# Patient Record
Sex: Female | Born: 1965 | Race: White | Hispanic: No | Marital: Single | Smoking: Never smoker
Health system: Southern US, Community
[De-identification: ages and names within clinical notes are randomized; demographics above are authoritative.]

## PROBLEM LIST (undated history)

## (undated) DIAGNOSIS — R51 Headache: Secondary | ICD-10-CM

## (undated) DIAGNOSIS — Z87442 Personal history of urinary calculi: Secondary | ICD-10-CM

## (undated) DIAGNOSIS — B019 Varicella without complication: Secondary | ICD-10-CM

## (undated) DIAGNOSIS — M1990 Unspecified osteoarthritis, unspecified site: Secondary | ICD-10-CM

## (undated) HISTORY — DX: Headache: R51

## (undated) HISTORY — PX: ANKLE SURGERY: SHX546

## (undated) HISTORY — DX: Varicella without complication: B01.9

## (undated) HISTORY — PX: SHOULDER ARTHROSCOPY: SHX128

## (undated) HISTORY — DX: Unspecified osteoarthritis, unspecified site: M19.90

## (undated) HISTORY — PX: HAND SURGERY: SHX662

---

## 1998-12-07 ENCOUNTER — Ambulatory Visit (HOSPITAL_BASED_OUTPATIENT_CLINIC_OR_DEPARTMENT_OTHER): Admission: RE | Admit: 1998-12-07 | Discharge: 1998-12-07 | Payer: Self-pay | Admitting: Orthopedic Surgery

## 2002-04-23 ENCOUNTER — Emergency Department (HOSPITAL_COMMUNITY): Admission: EM | Admit: 2002-04-23 | Discharge: 2002-04-23 | Payer: Self-pay | Admitting: Emergency Medicine

## 2002-12-01 ENCOUNTER — Inpatient Hospital Stay (HOSPITAL_COMMUNITY): Admission: AD | Admit: 2002-12-01 | Discharge: 2002-12-03 | Payer: Self-pay | Admitting: Orthopedic Surgery

## 2003-06-02 HISTORY — PX: JOINT REPLACEMENT: SHX530

## 2007-03-10 ENCOUNTER — Emergency Department (HOSPITAL_COMMUNITY): Admission: EM | Admit: 2007-03-10 | Discharge: 2007-03-11 | Payer: Self-pay | Admitting: Emergency Medicine

## 2007-12-10 ENCOUNTER — Ambulatory Visit (HOSPITAL_COMMUNITY): Admission: RE | Admit: 2007-12-10 | Discharge: 2007-12-10 | Payer: Self-pay | Admitting: Obstetrics and Gynecology

## 2010-12-04 NOTE — Op Note (Signed)
NAMEMAHEEN, CWIKLA               ACCOUNT NO.:  [id number]   MEDICAL RECORD NO.:  [id number]          PATIENT TYPE:  AMB   LOCATION:  SDC                           FACILITY:  [facility]   PHYSICIAN:  Lenoard Aden, M.D.DATE OF BIRTH:  [birth year]   DATE OF PROCEDURE:  12/10/2007  DATE OF DISCHARGE:                               OPERATIVE REPORT   PREOPERATIVE DIAGNOSIS:  Dysfunctional uterine bleeding, probable  structural lesion.   POSTOPERATIVE DIAGNOSIS:  Large endometrial polyp.   PROCEDURE:  Diagnostic hysteroscopy, D&C, resectoscopic polypectomy.   SURGEON:  Lenoard Aden, M.D.   ANESTHESIA:  MAC, paracervical.   ESTIMATED BLOOD LOSS:  Less than 50 mL.   COMPLICATIONS:  None.   DRAINS:  None.   COUNTS:  Correct.   Patient went to recovery in good condition.   FLUID DEFICIT:  150 mL.   OPERATIVE NOTE:  After being apprised of the risks of anesthesia,  infection, bleeding, injury to abdominal organs with need for repair,  delayed versus immediate complications to include bowel and bladder  complications due to uterine perforation, with possible need for repair,  the patient was brought to the operating room, and she was admitted IV  sedation without difficulty, prepped and draped in the usual sterile  fashion.  Catheterized until the bladder was emptied.  Exam under  anesthesia revealed a small anteflexed uterus.  No adnexal masses.  The  cervix was dilated up to a #23 Pratt dilator.  Diagnostic hysteroscope  placed.  Large hemorrhagic anterior wall polyp seen.  Hysteroscope  removed.  Resectoscope placed.  Please note that before placement of the  hysteroscope, further dilated up to a #33 Pratt dilator.  Please note  that prior to the dilatation, a paracervical block was placed using 30  mL of dilute Nesacaine solution in a standard fashion, and a dilute  Pitressin solution 16 mL total was placed at 3 and 9 o'clock at the  cervicovaginal junction.  Upon placement  of the resectoscope, it was  noted a large anterior wall polyp which was resected in multiple passes  using a single loop.  Good hemostasis was noted.  Visualization reveals  complete resection of polyp.  D&C is performed in a four-quadrant method  with a sharp endometrial curette.  Revisualization reveals the cavity to  be empty.  Good hemostasis was noted.  All instruments were removed.  The patient tolerated the procedure well and was transferred to the  recovery room in good condition.      Lenoard Aden, M.D.  Electronically Signed     RJT/MEDQ  D:  12/10/2007  T:  12/10/2007  Job:  [id number]

## 2010-12-07 NOTE — Discharge Summary (Signed)
Colleen Johnson, Colleen Johnson                           ACCOUNT NO.:  [id number]   MEDICAL RECORD NO.:  [id number]                   PATIENT TYPE:  INP   LOCATION:  5021                                 FACILITY:  [facility]   PHYSICIAN:  Deidre Ala, M.D.                 DATE OF BIRTH:  [birth year]   DATE OF ADMISSION:  11/30/2002  DATE OF DISCHARGE:  12/03/2002                                 DISCHARGE SUMMARY   ADMISSION DIAGNOSIS:  Patellofemoral degenerative joint disease right knee.   DISCHARGE DIAGNOSES:  1. Patellofemoral degenerative joint disease right knee.  2. Status post patellofemoral replacement.   HISTORY OF PRESENT ILLNESS AND HOSPITAL COURSE:  The patient was admitted to  [facility], taken to the operating room on Nov 30, 2002, for a  patellofemoral replacement.  It was initially planned to do a Fulkerson  slide with a possibility of the replacement depending on the intraoperative  findings.  A significant amount of degenerative joint disease was found on  the medial patellar facet, and we felt that she would not do well with the  slide, so a patellofemoral replacement was performed.  This was done under  general anesthesia with a femoral nerve block.  Tourniquet time was 1 hour  and 4 minutes.  The wound was closed over two medium Hemovac drains hooked  up to an Autovac.  There was an estimated blood loss of less then 100 cc,  and she was taken to the recovery room in stable condition.  On Dec 01, 2002, postoperative day #1, she was without complaints, had minimal pain,  and was well controlled with her PCA Dilaudid.  She had one episode of  vomiting the night before, but was doing better in the morning.  No nausea,  and she was taking p.o. well.  Her vital signs were stable, she was  afebrile.  The dressing over her knee was clean and dry, there was no active  drainage.  Her calf was soft and nontender, she was neurovascularly intact.  Postoperative labs showed a  hemoglobin of 11.4, hematocrit of 33, and serum  chemistries were normal except for a sodium of 133, potassium low at 3.4,  BUN low at 3, glucose elevated at 130.  She was given potassium chloride 20  mEq one daily.  They were encouraging her to use p.o. pain medication to  decrease reliance on PCA, encouraging her to ambulate weightbearing as  tolerated, anticipating discharge within the next few days.  Physical  therapy, occupational therapy, and rehabilitation were consulted.  They were  all in agreement that she could be discharged home with home health physical  therapy within a few days.  On Dec 02, 2002, postoperative day #2, she was  without complaints.  She had requested that they discontinue her IV, PCA,  and Foley earlier that morning.  She had been in CPM  from 0 to 40 degrees  without any difficulties.  Vitals were stable, she was afebrile, the wound  was benign, there was no erythema, ecchymoses, or drainage.  The drain was  discontinued.  It was pulled out intact.  Calf was soft and nontender, and  she was neurovascularly intact.  Repeat labs showed a hemoglobin of 11.4,  hematocrit of 34.6, and serum chemistries were normal except for a BUN low  at 3.  Her PCA and Foley were discontinued.  The IV was decreased to a  saline well.  We continued her on CPM, setting her for Lovenox post  discharge.  Home health physical therapy, home health CPM machine.  We  planned to send her home the next day if she felt up to.  On Dec 03, 2002,  postoperative day #3, she was without complaints and ready to go home.  She  was stating that she would need either crutches or a walker for her  discharge.  On physical examination, her vitals were stable, she was  afebrile, the right knee was benign, the calf was soft and nontender,  neurovascularly intact.  Hemoglobin was 11, hematocrit 34, and chemistries  normal except for a low BUN of 5.  Planned to discontinue her IV, discharge  her home  today.   ACTIVITY:  Weightbearing as tolerated on the right using an immobilizer on  the right knee, and crutches until physical therapy clears her for  ambulation without.   WOUND CARE:  Keep it clean and dry.  She may shower starting on Saturday,  but she is to leave the Steri-Strips alone until they peeled off their own  accord.   DIET:  As tolerated.   DISCHARGE INSTRUCTIONS:  Call for increasing pain, redness, drainage or  bleeding, numbness or tingling, coughing, shortness of breath, fever greater  then 100.5, or chills.   DISCHARGE MEDICATIONS:  1. OxyContin 10 mg one q.12h. #14.  2. Percocet 325/5 mg one to two p.o. q.4-6h. p.r.n. #50.'  3. Skelaxin 800 mg one p.o. q.6-8h. p.r.n. spasm or cramping #50 with two     refills.  4. Lovenox 40 mg one subcutaneously daily #6 with no refills.   CONDITION ON DISCHARGE:  Stable.   FOLLOWUP:  Follow up with Dr. Renae Fickle on Wednesday, Dec 15, 2002.  She was  instructed to call for an appointment.     Madilyn Fireman, P.A.-C.                       Deidre Ala, M.D.    AC/MEDQ  D:  12/03/2002  T:  12/04/2002  Job:  [id number]

## 2010-12-07 NOTE — Op Note (Signed)
NAMEANNIAH, GLICK                           ACCOUNT NO.:  [id number]   MEDICAL RECORD NO.:  [id number]                   PATIENT TYPE:  OIB   LOCATION:  2854                                 FACILITY:  [facility]   PHYSICIAN:  Deidre Ala, M.D.                 DATE OF BIRTH:  Jan 12, 1966   DATE OF PROCEDURE:  11/30/2002  DATE OF DISCHARGE:                                 OPERATIVE REPORT   PREOPERATIVE DIAGNOSIS:  Chondromalacia patella and anterior knee pain, rule  out patellofemoral arthrosis.   POSTOPERATIVE DIAGNOSIS:  Chondromalacia patella and anterior knee pain,  patellofemoral arthrosis.   OPERATION PERFORMED:  Right knee patellofemoral replacement with Depuy  components cemented, unicompartmental knee.   SURGEON:  Bradley Ferris, M.D.   ASSISTANT:  Madilyn Fireman, P.A.-C.   ANESTHESIA:  General endotracheal.   CULTURES:  None.   DRAINS:  Two medium Hemovacs to Autovac.   ESTIMATED BLOOD LOSS:  Less than .   BLOOD REPLACED:  Without.   TOURNIQUET TIME:  One hour four minutes.   PATHOLOGIC FINDINGS AND HISTORY:  The patient is a long term patient of mine  who has had several knee arthroscopies, lateral releases with findings of  medial chondromalacia and some defect in the trochlea though almost small.  She recently began having increasing knee pain.  She had some plica pain but  was miserable with discomfort. We got an MRI scan which showed thinning of  the cartilage on the patellar side and so we were conflicted as to whether  she would be best served with a Fulkerson slide type tubercle plasty with  Insall proximal realignment versus a unicompartmental joint replacement.  We  told her that we would have to make this decision at surgery.  At surgery,  it seemed to me that the patella was anatomically thick and she was somewhat  overstuffed in the anterior compartment.  There was a fair amount of scar  tissue we did excise but we felt that the femoral side  cartilage  was thin,  irregular and would not tolerate a Fulkerson slide well.  In fact, the  superior medial one fourth of the patella was severe chondromalacic and had  to be debrided to bone.  Therefore, we felt that the best procedure at this  point was a unicompartmental.  We sized her to a small.  We did decrease the  thickness of the patella by cutting from a 22 down to a 12 replacing 8 with  a 32 mm all poly button and we seemed to get tracking that was very  physiologic, not feeling to be overstuffed and tracking beautifully within  the joint with the replacement.   DESCRIPTION OF PROCEDURE:  With adequate anesthesia obtained using  endotracheal technique, 1g Ancef given IV prophylaxis and another one at  tourniquet let down, the patient was placed in supine position.  The  right  lower extremity was prepped and draped in standard fashion.  We then made an  incision slightly medial to the patella and then lateral over the tubercle  if we were going to do the tubercle-plasty.  Incision was deepened sharply  with a knife and hemostasis obtained using the Bovie electrocoagulator.  We  then used the Insall approach to the knee by incising the patellar ligament  over the patella and taking it medially entering the joint through a medial  arthrotomy and extending the incision splitting the patellar tendon and its  ligament in the quadriceps.  We then everted the patella, debrided the back,  assessed the joint, found nothing at the joint line.  She was very tiny and  has a very tiny knee.  We therefore then decided to proceed with the  patellofemoral replacement.  We then sized to the small.  We marked the  outline with methylene blue and then used the osteotome and bur to create  the defect for the patella for the femoral component to be flush in the  groove.  The tip was just above the notch.  We then went back and forth,  back and forth with the bur until we got perfect fit from our  viewpoint and  got down to the subchondral bone. We then drilled the three holes.  We then  put the trial in place, impacted it.  I then cut the patella using free hand  technique from a 22 down to a 12.  I placed a 3-hole guide, drilled it,  placed the patellar button trial on, slightly medializing it with the  position and then took the knee through a range of motion from 0 to 45.  I  then removed all trial components, jet lavaged the knee, mixed the cement  with 750 mg of Zinacef one batch and then cemented on the femoral component,  impacted it, removed the excess cement, cemented on the patellar component,  impacted it and removed excess cement and held it until the cement had  cured.  We then let the tourniquet down, bleeding points were cauterized.  Hemovac drains were placed in medial and lateral gutters and brought out the  superolateral portal. The wound was then closed in layers with #1 Vicryl on  the tendon and retinaculum.  An oversew of #1 PDS interrupted locking 3-0  and 4-0 Vicryl and running 3-0 Monocryl with Steri-Strips.  A bulky sterile  compressive dressing was applied.  Hemovac hooked up to Autovac and the  patient had a knee immobilizer placed after sterile dressing was applied to  be later having a femoral nerve block.  The patient was then admitted for  overnight routine care from the post anesthesia care unit.  She was  delivered to the PACU in satisfactory condition.                                                Deidre Ala, M.D.    VEP/MEDQ  D:  11/30/2002  T:  12/01/2002  Job:  [id number]

## 2011-04-17 LAB — CBC
Platelets: 330
RDW: 13.5
WBC: 6

## 2012-04-08 ENCOUNTER — Ambulatory Visit (INDEPENDENT_AMBULATORY_CARE_PROVIDER_SITE_OTHER): Payer: 59 | Admitting: Internal Medicine

## 2012-04-08 ENCOUNTER — Encounter: Payer: Self-pay | Admitting: Internal Medicine

## 2012-04-08 VITALS — BP 102/64 | HR 74 | Temp 98.0°F | Ht 61.25 in | Wt 116.0 lb

## 2012-04-08 DIAGNOSIS — R079 Chest pain, unspecified: Secondary | ICD-10-CM

## 2012-04-08 MED ORDER — GABAPENTIN 300 MG PO CAPS: 300.0000 mg | ORAL_CAPSULE | Freq: Three times a day (TID) | ORAL | Status: DC

## 2012-04-08 NOTE — Progress Notes (Signed)
  Subjective:    Patient ID: Colleen Johnson, female    DOB: [birth year], 46 y.o.   MRN: [id number]  HPI New  patient, here with her mother One-year history of on and off chest pain, 2 months history of steady pain. The pain is located at both sides of the upper anterior chest, no radiation, no change with deep breaths or eating. She had extensive evaluation in the recent past including a stress test, EGD and a rheumatology evaluation, test for "inflammation" were negative. Initially was prescribed citalopram and 3 days ago started Lyrica for the provisional diagnosis of myofascial pain. Lyrica is causing headaches and pains.   Past medical history CP DJD  Past surgical history Tonsillectomy Ortho Dr Renae Fickle Shoulder scopes x 2 Hand (R) x 2 surgeries Knee (R) scope and partial replacement Uterine polyp removal   Social history Single, no children, G0, lives w/ mom-dad Occupation-- Biomedical scientist tobacco-- no ETOH-- no   Family history Diabetes-- no CAD-- GP, mother side , no issues before 19 y/o Stroke-- F at age 7 Colon cancer-- no Breast cancer-- no Cancer GF, type?  Review of Systems No fever chills Occasional nausea if the pain is severe,  no diarrhea. No cough,  difficulty breathing, no weight loss. Denies heartburn. Denies any type of rash in the area of pain. No neck pain per se Had a mammogram 2 years ago.     Objective:   Physical Exam  Musculoskeletal:       Arms:  General -- alert, well-developed, and well-nourished.   Neck --no thyromegaly ,  no LADs, no tender to palpation Breasts-- No mass, nodules, thickening, tenderness, bulging, retraction, inflamation, nipple discharge or skin changes noted.  no axillary lymph nodes Lungs -- normal respiratory effort, no intercostal retractions, no accessory muscle use, and normal breath sounds.   Heart-- normal rate, regular rhythm, no murmur, and no gallop.   Abdomen--soft, non-tender, no distention, no  masses, no HSM, no guarding, and no rigidity.   Extremities-- no pretibial edema bilaterally Neurologic-- alert & oriented X3 and strength normal in all extremities. Psych-- Cognition and judgment appear intact. Alert and cooperative with normal attention span and concentration.  not anxious appearing and not depressed appearing.       Assessment & Plan:   Today , I spent more than 35  min with the patient, >50% of the time counseling, and reviewing her complex medical history

## 2012-04-08 NOTE — Patient Instructions (Addendum)
Get all old records from your previous MDs. Stop Lyrica Neurontin 300 mg: 1 tablet at night for 5 days, then one tablet twice a day x 5 days , then one tablet 3 times a day Please come back in 4 weeks

## 2012-04-08 NOTE — Assessment & Plan Note (Addendum)
Chest pain on and off for one year, steady for 2 months. Status post cardiology and GI evaluation with negative results. She also saw rheumatology. Was eventually diagnosed with a myofascial pain, prescribed citalopram few weeks ago and Lyrica x last 3 days; also on Ultram and Celebrex which she takes for DJD as well. She is not tolerating Lyrica will due to headaches and shakiness. Apparently, workup did not include a gallbladder ultrasound or a CT of the chest Plan: Get records Stop Lyrica consider a gallbladder ultrasound or a CT chest. Pain management? She also requests something different from pain, will start Neurontin.

## 2012-04-13 ENCOUNTER — Telehealth: Payer: Self-pay | Admitting: Internal Medicine

## 2012-04-13 NOTE — Telephone Encounter (Signed)
Pt scheduled with Dr. Laury Axon at 11:00am tomorrow.

## 2012-04-13 NOTE — Telephone Encounter (Signed)
Caller: Colleen Johnson/Patient; Patient Name: Colleen Johnson; PCP: Willow Ora; Best Callback Phone Number: [phone]; Reason for call: Face Swelling.  Onset 04/13/12.  Sore Throat onset 9/23.  Relates she recently started Tramadol and feels is causing Headaches that cause Vomiting.  Emergent symptoms ruled out.  Home care for the interim and parameters for callback.  See provider in 24 hours per Edema, Atraumatic protocol.   Caller requests appointment for 04/13/12 with Dr. Drue Novel only.  Note to office for follow up.

## 2012-04-13 NOTE — Telephone Encounter (Signed)
Noted  

## 2012-04-14 ENCOUNTER — Telehealth: Payer: Self-pay | Admitting: Internal Medicine

## 2012-04-14 ENCOUNTER — Encounter: Payer: Self-pay | Admitting: Family Medicine

## 2012-04-14 ENCOUNTER — Ambulatory Visit (HOSPITAL_BASED_OUTPATIENT_CLINIC_OR_DEPARTMENT_OTHER)
Admission: RE | Admit: 2012-04-14 | Discharge: 2012-04-14 | Disposition: A | Discharge status: Home / Self Care | Payer: 59 | Source: Ambulatory Visit | Attending: Family Medicine | Admitting: Family Medicine

## 2012-04-14 ENCOUNTER — Ambulatory Visit (INDEPENDENT_AMBULATORY_CARE_PROVIDER_SITE_OTHER): Payer: 59 | Admitting: Family Medicine

## 2012-04-14 VITALS — BP 102/64 | HR 65 | Temp 98.3°F | Wt 114.4 lb

## 2012-04-14 DIAGNOSIS — T782XXA Anaphylactic shock, unspecified, initial encounter: Secondary | ICD-10-CM

## 2012-04-14 DIAGNOSIS — F411 Generalized anxiety disorder: Secondary | ICD-10-CM

## 2012-04-14 DIAGNOSIS — R079 Chest pain, unspecified: Secondary | ICD-10-CM

## 2012-04-14 DIAGNOSIS — R0789 Other chest pain: Secondary | ICD-10-CM

## 2012-04-14 DIAGNOSIS — F419 Anxiety disorder, unspecified: Secondary | ICD-10-CM

## 2012-04-14 LAB — HEPATIC FUNCTION PANEL
ALT: 59 U/L — ABNORMAL HIGH (ref 0–35)
Albumin: 4 g/dL (ref 3.5–5.2)
Bilirubin, Direct: 0 mg/dL (ref 0.0–0.3)
Total Protein: 7 g/dL (ref 6.0–8.3)

## 2012-04-14 LAB — CBC WITH DIFFERENTIAL/PLATELET
Basophils Relative: 0.8 % (ref 0.0–3.0)
Eosinophils Relative: 2.2 % (ref 0.0–5.0)
HCT: 44.6 % (ref 36.0–46.0)
Hemoglobin: 14.6 g/dL (ref 12.0–15.0)
MCHC: 32.7 g/dL (ref 30.0–36.0)
MCV: 89 fl (ref 78.0–100.0)
Monocytes Absolute: 0.5 10*3/uL (ref 0.1–1.0)
Neutro Abs: 3.2 10*3/uL (ref 1.4–7.7)
Neutrophils Relative %: 63.9 % (ref 43.0–77.0)
RBC: 5.01 Mil/uL (ref 3.87–5.11)
WBC: 5 10*3/uL (ref 4.5–10.5)

## 2012-04-14 LAB — BASIC METABOLIC PANEL
CO2: 26 mEq/L (ref 19–32)
Chloride: 101 mEq/L (ref 96–112)
Potassium: 4.5 mEq/L (ref 3.5–5.1)
Sodium: 142 mEq/L (ref 135–145)

## 2012-04-14 LAB — CARDIAC PANEL: Total CK: 56 U/L (ref 7–177)

## 2012-04-14 MED ORDER — IOHEXOL 350 MG/ML SOLN
80.0000 mL | Freq: Once | INTRAVENOUS | Status: AC | PRN
  Administered 2012-04-14: 80 mL via INTRAVENOUS

## 2012-04-14 NOTE — Patient Instructions (Signed)
Chest Pain (Nonspecific) It is often hard to give a specific diagnosis for the cause of chest pain. There is always a chance that your pain could be related to something serious, such as a heart attack or a blood clot in the lungs. You need to follow up with your caregiver for further evaluation. CAUSES   Heartburn.   Pneumonia or bronchitis.   Anxiety or stress.   Inflammation around your heart (pericarditis) or lung (pleuritis or pleurisy).   A blood clot in the lung.   A collapsed lung (pneumothorax). It can develop suddenly on its own (spontaneous pneumothorax) or from injury (trauma) to the chest.   Shingles infection (herpes zoster virus).  The chest wall is composed of bones, muscles, and cartilage. Any of these can be the source of the pain.  The bones can be bruised by injury.   The muscles or cartilage can be strained by coughing or overwork.   The cartilage can be affected by inflammation and become sore (costochondritis).  DIAGNOSIS  Lab tests or other studies, such as X-rays, electrocardiography, stress testing, or cardiac imaging, may be needed to find the cause of your pain.  TREATMENT   Treatment depends on what may be causing your chest pain. Treatment may include:   Acid blockers for heartburn.   Anti-inflammatory medicine.   Pain medicine for inflammatory conditions.   Antibiotics if an infection is present.   You may be advised to change lifestyle habits. This includes stopping smoking and avoiding alcohol, caffeine, and chocolate.   You may be advised to keep your head raised (elevated) when sleeping. This reduces the chance of acid going backward from your stomach into your esophagus.   Most of the time, nonspecific chest pain will improve within 2 to 3 days with rest and mild pain medicine.  HOME CARE INSTRUCTIONS   If antibiotics were prescribed, take your antibiotics as directed. Finish them even if you start to feel better.   For the next few  days, avoid physical activities that bring on chest pain. Continue physical activities as directed.   Do not smoke.   Avoid drinking alcohol.   Only take over-the-counter or prescription medicine for pain, discomfort, or fever as directed by your caregiver.   Follow your caregiver's suggestions for further testing if your chest pain does not go away.   Keep any follow-up appointments you made. If you do not go to an appointment, you could develop lasting (chronic) problems with pain. If there is any problem keeping an appointment, you must call to reschedule.  SEEK MEDICAL CARE IF:   You think you are having problems from the medicine you are taking. Read your medicine instructions carefully.   Your chest pain does not go away, even after treatment.   You develop a rash with blisters on your chest.  SEEK IMMEDIATE MEDICAL CARE IF:   You have increased chest pain or pain that spreads to your arm, neck, jaw, back, or abdomen.   You develop shortness of breath, an increasing cough, or you are coughing up blood.   You have severe back or abdominal pain, feel nauseous, or vomit.   You develop severe weakness, fainting, or chills.   You have a fever.  THIS IS AN EMERGENCY. Do not wait to see if the pain will go away. Get medical help at once. Call your local emergency services (911 in [place] Do not drive yourself to the hospital. MAKE SURE YOU:   Understand these instructions.     Will watch your condition.   Will get help right away if you are not doing well or get worse.  Document Released: 04/17/2005 Document Revised: 06/27/2011 Document Reviewed: 02/11/2008 [employer] Patient Information 2012 [employer]. 

## 2012-04-14 NOTE — Telephone Encounter (Signed)
Caller: Aarti/Patient; Patient Name: Colleen Johnson; PCP: Lelon Perla.; Jaxon is calling to find out what the MD would like her to take to control her chest pain. She is concerned that the pain will cause her to become Nauseous and start Vomiting. She is having pain #4 ( on 1-10 scale.) and increasing. She is refusing triage. She uses [facility] on [address] PLEASE ASK DR. PAZ AND CALL HER BACK.  Best Callback Phone Number: [phone]

## 2012-04-14 NOTE — Telephone Encounter (Signed)
When I saw her last time, I was considering a CT. Also pain management. CT today negative consequently I recommend a referral to pain management, please arrange Also, notify patient I have not seen her records from the previous physician

## 2012-04-14 NOTE — Progress Notes (Signed)
  Subjective:    Patient ID: Colleen Johnson, female    DOB: 1966-03-14, 46 y.o.   MRN: [id number]  HPI Pt here f/u chest pain.  See previous ov notes.  Pt has had cardio and rheum w/u -- all neg.   Dr Drue Novel mentioned to pt [place] abd vs ct chest but was waiting for her records from previous pcp.   Pt was upset and said something had to be done because she was on verge of losing job and "when were those tests being done?"  She originally ws here because she had a reaction to gabepentin. She stopped it 2 days ago and now feels fine.   She also can not take tramadol because of side effects.   No sob.     Review of Systems as above   Objective:   Physical Exam  Nursing note and vitals reviewed. Constitutional: She is oriented to person, place, and time. She appears well-developed and well-nourished.  Neck: Normal range of motion. Neck supple.  Cardiovascular: Normal rate, regular rhythm and normal heart sounds.  Exam reveals no gallop and no friction rub.   No murmur heard. Pulmonary/Chest: No respiratory distress. She has no wheezes. She has no rales. She exhibits tenderness.  Abdominal: Soft. Bowel sounds are normal. She exhibits mass. She exhibits no distension. There is no tenderness. There is no rebound and no guarding.  Musculoskeletal: She exhibits no edema.  Neurological: She is alert and oriented to person, place, and time.  Psychiatric:       Anxious  Insisted that something be done for her pain          Assessment & Plan:

## 2012-04-14 NOTE — Telephone Encounter (Signed)
Discussed with patient and she has been made aware cardiac panel and CT were both neg, she is still having the chest pain and would like something else for the pain. Please advise     KP

## 2012-04-14 NOTE — Assessment & Plan Note (Signed)
Cardiac and rheum w/u done.  She has also seen GI----all normal con't celexa ---f/u pcp to adjust dose and discuss further

## 2012-04-15 ENCOUNTER — Emergency Department (HOSPITAL_COMMUNITY)
Admission: EM | Admit: 2012-04-15 | Discharge: 2012-04-15 | Disposition: A | Discharge status: Home / Self Care | Payer: 59 | Attending: Emergency Medicine | Admitting: Emergency Medicine

## 2012-04-15 ENCOUNTER — Emergency Department (HOSPITAL_COMMUNITY): Payer: 59

## 2012-04-15 ENCOUNTER — Encounter (HOSPITAL_COMMUNITY): Payer: Self-pay | Admitting: *Deleted

## 2012-04-15 DIAGNOSIS — R1013 Epigastric pain: Secondary | ICD-10-CM | POA: Insufficient documentation

## 2012-04-15 DIAGNOSIS — R109 Unspecified abdominal pain: Secondary | ICD-10-CM

## 2012-04-15 DIAGNOSIS — M129 Arthropathy, unspecified: Secondary | ICD-10-CM | POA: Insufficient documentation

## 2012-04-15 DIAGNOSIS — R079 Chest pain, unspecified: Secondary | ICD-10-CM

## 2012-04-15 NOTE — Telephone Encounter (Signed)
Discussed & entered referral.

## 2012-04-15 NOTE — ED Notes (Addendum)
EMS reports pt co substernal chest pain no radiation for 2 weeks.  Pt seen at md for same and treated with pain meds. Pt has stress test yesterday.  Pain 4/10.  Pt allergies codien and morphine.  NSR on monitor, 82hr, 102/68 which is normal for pt.  99% RA.  EMS did orthostatic bp with no changes.  20g LFA Pt alert oriented X4

## 2012-04-15 NOTE — ED Notes (Signed)
Ct done yesterday, [facility]

## 2012-04-15 NOTE — Telephone Encounter (Signed)
LMOVM for pt to return call 

## 2012-04-15 NOTE — ED Provider Notes (Signed)
History     CSN: [id number]  Arrival date & time 04/15/12  4098   First MD Initiated Contact with Patient 04/15/12 [phone]      Chief Complaint  Patient presents with  . Chest Pain    (Consider location/radiation/quality/duration/timing/severity/associated sxs/prior treatment) HPI Comments: Colleen Johnson is a 46 y.o. Female who is here to be evaluated for "burning" chest pain. That has been present for 2 weeks. The pain worsened at 4 AM this morning, so she decided to come here for evaluation. She was seen yesterday by her PCP, and had a CT angiogram chest done because of ongoing chest pain. That was negative. She has occasional nausea, but it is not associated with eating. She has mild, intermittent abdominal pain, not persistent. She has no associated fever, chills, cough, or shortness of breath. She has tried Nexium for 2 weeks, without relief. She continues to take Celebrex for pain. She did not have a stress test yesterday as indicated by nursing, with documentation at 08:28. She has intolerance of narcotic pain medicine that causes vomiting. There are no known aggravating or palliative factors.  Patient is a 46 y.o. female presenting with chest pain. The history is provided by the patient.  Chest Pain     Past Medical History  Diagnosis Date  . Arthritis   . Chicken pox   . Headache     No past surgical history on file.  No family history on file.  History  Substance Use Topics  . Smoking status: Never Smoker   . Smokeless tobacco: Never Used  . Alcohol Use: No    OB History    Grav Para Term Preterm Abortions TAB SAB Ect Mult Living                  Review of Systems  Cardiovascular: Positive for chest pain.  All other systems reviewed and are negative.    Allergies  Codeine; Gabapentin; Morphine and related; Sulfa antibiotics; Tramadol; and Lyrica  Home Medications   Current Outpatient Rx  Name Route Sig Dispense Refill  . CETIRIZINE HCL 10  MG PO TABS Oral Take 10 mg by mouth daily.    Marland Kitchen CITALOPRAM HYDROBROMIDE 10 MG PO TABS Oral Take 10 mg by mouth daily.    Marland Kitchen PAPAYA PO Oral Take 5 tablets by mouth daily as needed. For upset stomach    . SUCRALFATE 1 G PO TABS Oral Take 1 tablet (1 g total) by mouth 4 (four) times daily. 60 tablet 1    BP 101/63  Pulse 81  Temp 98 F (36.7 C) (Oral)  Resp 14  SpO2 98%  LMP 03/23/2012  Physical Exam  Nursing note and vitals reviewed. Constitutional: She is oriented to person, place, and time. She appears well-developed and well-nourished.  HENT:  Head: Normocephalic and atraumatic.  Eyes: Conjunctivae normal and EOM are normal. Pupils are equal, round, and reactive to light.  Neck: Normal range of motion and phonation normal. Neck supple.  Cardiovascular: Normal rate, regular rhythm and intact distal pulses.   Pulmonary/Chest: Effort normal and breath sounds normal. She exhibits no tenderness.  Abdominal: Soft. She exhibits no distension. There is tenderness (Mild epigastric). There is no guarding.  Musculoskeletal: Normal range of motion.  Neurological: She is alert and oriented to person, place, and time. She has normal strength. She exhibits normal muscle tone.  Skin: Skin is warm and dry.  Psychiatric: Her behavior is normal. Judgment and thought content normal.  Anxious    ED Course  Procedures (including critical care time)    Date: 04/15/2012  Rate: 72  Rhythm: normal sinus rhythm  QRS Axis: right  Intervals: normal  ST/T Wave abnormalities: nonspecific T wave changes  Conduction Disutrbances:none  Narrative Interpretation:   Old EKG Reviewed: none available  Ct Angio Chest W/cm &/or Wo Cm  04/14/2012  *RADIOLOGY REPORT*  Clinical Data: Left chest pain.  CT ANGIOGRAPHY CHEST  Technique:  Multidetector CT imaging of the chest using the standard protocol during bolus administration of intravenous contrast. Multiplanar reconstructed images including MIPs were  obtained and reviewed to evaluate the vascular anatomy.  Contrast: 80mL OMNIPAQUE IOHEXOL 350 MG/ML SOLN  Comparison: None.  Findings: No filling defects in the pulmonary arteries to suggest pulmonary emboli. Heart is normal size. Aorta is normal caliber. No mediastinal, hilar, or axillary adenopathy.  Visualized thyroid and chest wall soft tissues unremarkable. Lungs are clear.  No focal airspace opacities or suspicious nodules.  No effusions. Imaging into the upper abdomen shows no acute findings.  No acute bony abnormality.  IMPRESSION: No acute findings.   Original Report Authenticated By: Cyndie Chime, M.D.    US Abdomen Complete  04/15/2012  *RADIOLOGY REPORT*  Clinical Data:  Pain.  COMPLETE ABDOMINAL ULTRASOUND  Comparison:  None.  Findings:  Gallbladder:  No gallstones, gallbladder wall thickening, or pericholecystic fluid.  Common bile duct:   Within normal limits in caliber.  Liver:  No focal lesion identified.  Within normal limits in parenchymal echogenicity.  IVC:  Appears normal.  Pancreas:  No focal abnormality seen.  Spleen:  Within normal limits in size and echotexture.  Right Kidney:   Normal in size and parenchymal echogenicity.  No evidence of mass or hydronephrosis.  Left Kidney:  Normal in size and parenchymal echogenicity.  No evidence of mass or hydronephrosis.  Abdominal aorta:  No aneurysm identified.  IMPRESSION: Negative abdominal ultrasound.   Original Report Authenticated By: Cyndie Chime, M.D.       Labs Reviewed - No data to display No results found.   1. Chest pain, unspecified   2. Abdominal pain of unknown etiology       MDM  Nonspecific chest and abdominal pain with labs and imaging from 9/24-9/25, normal. Doubt ACS, PE, GB disease. Doubt metabolic instability, serious bacterial infection or impending vascular collapse; the patient is stable for discharge.   Plan: Home Medications- usual; Home Treatments- rest; Recommended follow up- PCP in 5-7 days for  check up        Flint Melter, MD 04/18/12 1147

## 2012-04-16 ENCOUNTER — Telehealth: Payer: Self-pay | Admitting: Internal Medicine

## 2012-04-16 ENCOUNTER — Ambulatory Visit (INDEPENDENT_AMBULATORY_CARE_PROVIDER_SITE_OTHER): Payer: 59 | Admitting: Internal Medicine

## 2012-04-16 ENCOUNTER — Encounter: Payer: Self-pay | Admitting: Internal Medicine

## 2012-04-16 VITALS — BP 122/70 | HR 73 | Temp 97.8°F | Resp 14 | Wt 111.4 lb

## 2012-04-16 DIAGNOSIS — M62838 Other muscle spasm: Secondary | ICD-10-CM

## 2012-04-16 DIAGNOSIS — IMO0001 Reserved for inherently not codable concepts without codable children: Secondary | ICD-10-CM

## 2012-04-16 DIAGNOSIS — M2550 Pain in unspecified joint: Secondary | ICD-10-CM

## 2012-04-16 DIAGNOSIS — M791 Myalgia, unspecified site: Secondary | ICD-10-CM

## 2012-04-16 DIAGNOSIS — R0789 Other chest pain: Secondary | ICD-10-CM

## 2012-04-16 MED ORDER — SUCRALFATE 1 G PO TABS: 1.0000 g | ORAL_TABLET | Freq: Four times a day (QID) | ORAL | Status: DC

## 2012-04-16 NOTE — Patient Instructions (Addendum)
Mild elevation of 2  liver enzyme test; avoid excess Tylenol, alcohol & vitamin A.  If you activate My Chart; the results can be released to you as soon as they populate from the lab. If you choose not to use this program; the labs have to be reviewed, copied & mailed   causing a delay in getting the results to you.  Stop all supplements. Dissolve 1 tablet in 5 cc (1 teaspoon) of water. Take before meals and at bedtime      Please remain out of work until 04/20/12.

## 2012-04-16 NOTE — Telephone Encounter (Signed)
Caller: [name]/Patient; Patient Name: Colleen Johnson; PCP: Willow Ora; Best Callback Phone Number: [phone].  Called re work requires MD note since out of work all week.  Reports still very sick with ongoing vomiting and body spasms. Last emesis dry heaves at 0900.  No diarrhea.  Called 911 04/15/12 for severe chest with body spasms.  Was told to follow up with Dr Drue Novel for referral to specialist. Gallbladder scan negative.  Did not take Citalopram since 04/14/12. Constant chest pain under bilateral breasts rated 4-5 out of 10.  Pain worsens if coughs.  Reports "convulsions" described as "severe spasming of entire body" lasting 5-20 seconds without loss of consciousness.  Onset 1600 04/15/12.  Thinks spasms are withdrawl from Tramadol and Neurontin.  Last one approximately 1018.  LMP 03/22/12. Not sexually active. Advised to see MD within 4 hours for repeated vomiting for > 8 hours and unable to keep any fluids down  and new onset of moderate tremors per Withdrawl Symptoms guideline.  No appointments remain with Dr Drue Novel or MD of PM, Dr Laury Axon.  30 minute appointment scheduled for Dr Alwyn Ren at [facility] 04/16/12.

## 2012-04-16 NOTE — Progress Notes (Signed)
  Subjective:    Patient ID: Colleen Johnson, female    DOB: July 10, 1966, 46 y.o.   MRN: [id number]  HPI She returns complaining of diffuse myalgias and arthralgias; she describes it as "my entire body hurts". She has been evaluated here 9/18 and 9/24. Those records were reviewed. The lab & imaging studies were also reviewed.  Despite stopping the gabapentin and tramadol the symptoms have persisted. She states that  her body will stiffen and she shakes.  Pertinent negatives were normal calcium, potassium, and CK. Despite nausea and vomiting; there is no evidence of dehydration or hypokalemia. There is mild elevation of the 2 liver enzymes. Ultrasound did not reveal abnormalities of the gallbladder or common bile duct.    Review of Systems She denies dysuria, pyuria, or hematuria. Her stool was becoming watery. There has been no Clay-colored stool or  Coke-colored urine. She describes constant substernal burning for several months. She denies dysphagia.  Citalopram was prescribed approximately 2 months ago to help with the pain symptoms. She was on tramadol for a month; this was discontinued  9/23. Her mother questions a withdrawal syndrome related to stopping the tramadol suddenly. She has had various symptoms prior to starting on these medications.  She's had headaches related to her medicines. She denies a constellation of headache, flushing, chest pain, and diarrhea.     Objective:   Physical Exam  General appearance is one of good health and nourishment w/o distress. Appears fatigued  Eyes: No conjunctival inflammation or scleral icterus is present.  Neck: Supple; thyroid normal to palpation  Oral exam: Dental hygiene is good; lips and gums are healthy appearing.There is no oropharyngeal erythema or exudate noted.   Heart:  Normal rate and regular rhythm. S1 and S2 normal without gallop, murmur, click, rub or other extra sounds     Lungs:Chest clear to auscultation; no  wheezes, rhonchi,rales ,or rubs present.No increased work of breathing.   Abdomen: bowel sounds normal, soft and non-tender without masses, organomegaly or hernias noted.  No guarding or rebound.Aorta palpable ; no AAA . Umbilical ring   Skin:Warm & dry.  Intact without suspicious lesions or rashes ; no jaundice or tenting. Tatoo LS area  Musculoskeletal: Negative straight leg raising. No cyanosis, clubbing, or edema  Lymphatic: No lymphadenopathy is noted about the head, neck, axilla areas.             Assessment & Plan:  #1 diffuse arthralgias and myalgias with normal chemistries and CK  #2 substernal burning without dysphagia  #3 mild elevation of hepatic enzymes  #4 early onset diarrhea

## 2012-04-17 ENCOUNTER — Encounter: Payer: Self-pay | Admitting: Physical Medicine & Rehabilitation

## 2012-04-17 LAB — AST: AST: 30 U/L (ref 0–37)

## 2012-04-17 LAB — HEPATITIS PANEL, ACUTE
Hep A IgM: NEGATIVE
Hepatitis B Surface Ag: NEGATIVE

## 2012-04-17 LAB — SEDIMENTATION RATE: Sed Rate: 10 mm/hr (ref 0–22)

## 2012-04-17 LAB — ALT: ALT: 46 U/L — ABNORMAL HIGH (ref 0–35)

## 2012-04-17 LAB — C-REACTIVE PROTEIN: CRP: <0.5 mg/dL (ref 0.5–20.0)

## 2012-04-21 ENCOUNTER — Encounter: Payer: 59 | Attending: Physical Medicine & Rehabilitation

## 2012-04-21 ENCOUNTER — Encounter: Payer: Self-pay | Admitting: Physical Medicine & Rehabilitation

## 2012-04-21 ENCOUNTER — Ambulatory Visit (HOSPITAL_BASED_OUTPATIENT_CLINIC_OR_DEPARTMENT_OTHER): Payer: 59 | Admitting: Physical Medicine & Rehabilitation

## 2012-04-21 VITALS — BP 96/63 | HR 86 | Resp 14 | Ht 61.0 in | Wt 111.0 lb

## 2012-04-21 DIAGNOSIS — IMO0001 Reserved for inherently not codable concepts without codable children: Secondary | ICD-10-CM | POA: Insufficient documentation

## 2012-04-21 DIAGNOSIS — R0789 Other chest pain: Secondary | ICD-10-CM | POA: Insufficient documentation

## 2012-04-21 MED ORDER — DICLOFENAC SODIUM 1 % TD GEL: 2.0000 g | Freq: Four times a day (QID) | TRANSDERMAL | Status: DC

## 2012-04-21 NOTE — Patient Instructions (Addendum)
The of myofascial pain in the pectoralis muscle Treatment will be physical therapy, they will call you for Appointment Voltaren gel will be sent to your pharmacy   may return to work without restrictions

## 2012-04-21 NOTE — Progress Notes (Signed)
Subjective:    Patient ID: Colleen Johnson, female    DOB: 04-May-1966, 46 y.o.   MRN: [id number]  HPI Chief complaint: Two-month history of chest pain 46 year old female who has had intermittent chest pain over the last several years. 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 Her pain has become constant over last 2 months. She has had extensive medical workup including EKG and cardiac stress test which were negative, CT angiogram of her Chest which was negative, rheumatologic workup which was negative, gastrointestinal consultation which was negative. She has tried various medications codeine causing nausea and vomiting, tramadol causing headaches, Lyrica Causing headaches and shaking. Lidocaine patches were not helpful She has not tried physical therapy She has not tried injections  Review of systems positive for knee pain right greater than left Pain Inventory Average Pain 4 Pain Right Now 4 My pain is sharp and burning  In the last 24 hours, has pain interfered with the following? General activity 7 Relation with others 7 Enjoyment of life 10 What TIME of day is your pain at its worst? All Day Sleep (in general) Fair  Pain is worse with: unsure Pain improves with: heat/ice Relief from Meds: 0  Mobility walk without assistance ability to climb steps?  yes do you drive?  yes  Function employed # of hrs/week 40  Neuro/Psych depression anxiety  Prior Studies x-rays CT/MRI  Physicians involved in your care Primary care Dr. Drue Novel   History reviewed. No pertinent family history. History   Social History  . Marital Status: Single    Spouse Name: N/A    Number of Children: N/A  . Years of Education: N/A   Social History Main Topics  . Smoking status: Never Smoker   . Smokeless tobacco: Never Used  . Alcohol Use: No  . Drug Use: No  . Sexually Active: None   Other Topics Concern  . None   Social History Narrative  . None   History reviewed. No  pertinent past surgical history. Past Medical History  Diagnosis Date  . Arthritis   . Chicken pox   . Headache    LMP 03/23/2012      Review of Systems  Constitutional: Negative.   HENT: Negative.   Eyes: Negative.   Respiratory: Negative.   Cardiovascular: Positive for chest pain.  Gastrointestinal: Negative.   Genitourinary: Negative.   Musculoskeletal: Negative.   Skin: Negative.   Neurological: Negative.   Hematological: Negative.   Psychiatric/Behavioral:       Depression/Anxiety       Objective:   Physical Exam  Constitutional: She is oriented to person, place, and time. She appears well-developed and well-nourished.  HENT:  Head: Normocephalic and atraumatic.  Eyes: Conjunctivae normal and EOM are normal. Pupils are equal, round, and reactive to light.  Neck: Normal range of motion. Neck supple.  Cardiovascular: Normal rate, regular rhythm and normal heart sounds.   Pulmonary/Chest: Effort normal and breath sounds normal.  Abdominal: Soft. Bowel sounds are normal.  Musculoskeletal:       Right shoulder: Normal.       Left shoulder: Normal.       Cervical back: Normal.       Thoracic back: Normal.       Tenderness to palpation along the pectoralis muscle origin along the sternum. Tenderness along the clavicular portion of the left pectoralis muscle Tight pectoralis with arm stretching behind the back Shoulders with negative impingement testing  Neurological: She is alert and  oriented to person, place, and time. She has normal strength and normal reflexes. She displays no atrophy. She exhibits normal muscle tone. Coordination and gait normal.  Psychiatric: Her affect is angry.          Assessment & Plan:  1. Myofascial pain pectoralis muscle left greater than right. Recommend Voltaren gel 4 times per day. Recommend heat Recommend physical therapy for pectoralis straight bending stretching as well as upper extremity and upper back  strengthening. Return to clinic one month

## 2012-04-21 NOTE — Addendum Note (Signed)
Addended by: Caryl Ada on: 04/21/2012 12:19 PM   Modules accepted: Orders

## 2012-04-24 ENCOUNTER — Telehealth: Payer: Self-pay | Admitting: Internal Medicine

## 2012-04-24 NOTE — Telephone Encounter (Signed)
Please advise 

## 2012-04-24 NOTE — Telephone Encounter (Signed)
Discussed with pt

## 2012-04-24 NOTE — Telephone Encounter (Signed)
Caller: [name]/Patient; Patient Name: Colleen Johnson; PCP: Willow Ora; Best Callback Phone Number: [phone] about starting on Carafate 4x daily on 04/16/12 and everyday since starting the medication she has had burning in her stomach and loose stools 3-4 times daily. Afebrile. She is eating ice chips and it cools down her stomach. Appetite diminished. Pain waking her up at night. Pain level #5 on 1-10 scale. Triage per Abdominal Pain Protocol and ED advised for "Abdominal pain that has steadily worsened over hours OR has been continuous for 3 hours or more AND any of the following:loss of appetite, vomiting starting after pain, any fever OR unable to carry out normal activities. She is refusing to go to ER and would like to stop med and needs advice of what to take instead. She would like to try Gavison or something to coat her stomach. Please ask Dr. Drue Novel or MD on call and call her back.

## 2012-04-24 NOTE — Telephone Encounter (Signed)
Discontinue Carafate Prilosec over-the-counter 20 mg 2 tablets a day before breakfast. Okay trial with Gaviscon if needed

## 2012-05-05 ENCOUNTER — Ambulatory Visit: Payer: 59 | Attending: Physical Medicine & Rehabilitation | Admitting: Physical Therapy

## 2012-05-05 DIAGNOSIS — R293 Abnormal posture: Secondary | ICD-10-CM | POA: Insufficient documentation

## 2012-05-05 DIAGNOSIS — IMO0001 Reserved for inherently not codable concepts without codable children: Secondary | ICD-10-CM | POA: Insufficient documentation

## 2012-05-06 ENCOUNTER — Ambulatory Visit (INDEPENDENT_AMBULATORY_CARE_PROVIDER_SITE_OTHER): Payer: 59 | Admitting: Internal Medicine

## 2012-05-06 VITALS — BP 104/62 | HR 75 | Temp 98.0°F | Wt 115.0 lb

## 2012-05-06 DIAGNOSIS — K219 Gastro-esophageal reflux disease without esophagitis: Secondary | ICD-10-CM

## 2012-05-06 DIAGNOSIS — R079 Chest pain, unspecified: Secondary | ICD-10-CM

## 2012-05-06 DIAGNOSIS — Z23 Encounter for immunization: Secondary | ICD-10-CM

## 2012-05-06 NOTE — Progress Notes (Signed)
  Subjective:    Patient ID: Colleen Johnson, female    DOB: [birth year], 46 y.o.   MRN: [id number]  HPI Followup visit Chest pain, went to see Dr. Jodean Lima, diagnosed with myofascial pain, started physical therapy and will use a voltaren gel. Feeling much better Also, had some GERD symptoms and dysphagia, currently on Nexium and doing well.   Past medical history CP DJD  Past surgical history Tonsillectomy Ortho Dr Renae Fickle Shoulder scopes x 2 Hand (R) x 2 surgeries Knee (R) scope and partial replacement Uterine polyp removal   Social history Single, no children, G0, lives w/ mom-dad Occupation-- Biomedical scientist tobacco-- no ETOH-- no   Family history Diabetes-- no CAD-- GP, mother side , no issues before 87 y/o Stroke-- F at age 2 Colon cancer-- no Breast cancer-- no Cancer GF, type?   Review of Systems Denies abdominal pain at this point, no nausea, vomiting, diarrhea or blood in the stools.     Objective:   Physical Exam  General -- alert, well-developed, and well-nourished.   Abdomen--soft, non-tender, no distention, no masses, no HSM, no guarding, and no rigidity.    Psych-- Cognition and judgment appear intact. Alert and cooperative with normal attention span and concentration.  not anxious appearing and not depressed appearing.      Assessment & Plan:

## 2012-05-06 NOTE — Patient Instructions (Addendum)
Continue with Nexium for the next 4 weeks, and then changing to "as needed only" Next visit in 6-8 months and as needed

## 2012-05-07 ENCOUNTER — Encounter: Payer: Self-pay | Admitting: Internal Medicine

## 2012-05-07 DIAGNOSIS — K219 Gastro-esophageal reflux disease without esophagitis: Secondary | ICD-10-CM | POA: Insufficient documentation

## 2012-05-07 NOTE — Assessment & Plan Note (Signed)
Chest pain, diagnosis is myofascial pain, doing great.

## 2012-05-07 NOTE — Assessment & Plan Note (Signed)
GERD, had some upper abdominal discomfort and dysphagia, on Nexium, will continue with Nexium for 4 weeks then change to as needed. To call if no better or more dysphagia

## 2012-05-12 ENCOUNTER — Ambulatory Visit: Payer: 59 | Admitting: Rehabilitation

## 2012-05-18 ENCOUNTER — Ambulatory Visit: Payer: 59 | Admitting: Physical Therapy

## 2012-05-19 ENCOUNTER — Ambulatory Visit: Payer: 59 | Admitting: Physical Medicine & Rehabilitation

## 2012-05-25 ENCOUNTER — Encounter: Payer: 59 | Attending: Physical Medicine & Rehabilitation

## 2012-05-25 ENCOUNTER — Ambulatory Visit (HOSPITAL_BASED_OUTPATIENT_CLINIC_OR_DEPARTMENT_OTHER): Payer: 59 | Admitting: Physical Medicine & Rehabilitation

## 2012-05-25 ENCOUNTER — Encounter: Payer: Self-pay | Admitting: Physical Medicine & Rehabilitation

## 2012-05-25 VITALS — BP 114/69 | HR 78 | Resp 14 | Ht 61.0 in | Wt 117.2 lb

## 2012-05-25 DIAGNOSIS — IMO0001 Reserved for inherently not codable concepts without codable children: Secondary | ICD-10-CM | POA: Insufficient documentation

## 2012-05-25 DIAGNOSIS — R0789 Other chest pain: Secondary | ICD-10-CM | POA: Insufficient documentation

## 2012-05-25 NOTE — Progress Notes (Signed)
  Subjective:    Patient ID: Colleen Johnson, female    DOB: Feb 19, 1966, 46 y.o.   MRN: [id number]  HPI 46 year old female who has had intermittent chest pain over the last several years. 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 Her pain has become constant over last 2 months. She has had extensive medical workup including EKG and cardiac stress test which were negative, CT angiogram of her Chest which was negative, rheumatologic workup which was negative, gastrointestinal consultation which was negative. She has tried various medications codeine causing nausea and vomiting, tramadol causing headaches, Lyrica Causing headaches and shaking. Lidocaine patches were not helpful Pain Inventory Average Pain 3 Pain Right Now 0 My pain is sharp, burning and aching  In the last 24 hours, has pain interfered with the following? General activity 0 Relation with others 0 Enjoyment of life 0 What TIME of day is your pain at its worst? varies Sleep (in general) Fair  Pain is worse with: some activites Pain improves with: ultrasound at therapy made it worse Relief from Meds: no pain mends  Mobility walk without assistance  Function employed # of hrs/week 40  Neuro/Psych depression anxiety  Prior Studies Any changes since last visit?  no  Physicians involved in your care Any changes since last visit?  no   History reviewed. No pertinent family history. History   Social History  . Marital Status: Single    Spouse Name: N/A    Number of Children: N/A  . Years of Education: N/A   Social History Main Topics  . Smoking status: Never Smoker   . Smokeless tobacco: Never Used  . Alcohol Use: No  . Drug Use: No  . Sexually Active: None   Other Topics Concern  . None   Social History Narrative  . None   History reviewed. No pertinent past surgical history. Past Medical History  Diagnosis Date  . Arthritis   . Chicken pox   . Headache    BP 114/69  Pulse 78  Resp  14  Ht 5\' 1"  (1.549 m)  Wt 117 lb 3.2 oz (53.162 kg)  BMI 22.14 kg/m2  SpO2 99%    Review of Systems  Musculoskeletal:       Mid chest pain  Psychiatric/Behavioral: Positive for dysphoric mood. The patient is nervous/anxious.   All other systems reviewed and are negative.       Objective:   Physical Exam  Head: Normocephalic and atraumatic.  Eyes: Conjunctivae normal and EOM are normal. Pupils are equal, round, and reactive to light.  Neck: Normal range of motion. Neck supple.  Cardiovascular: Normal rate, regular rhythm and normal heart sounds.  Pulmonary/Chest: Effort normal and breath sounds normal.  Abdominal: Soft. Bowel sounds are normal.  Musculoskeletal:  Right shoulder: Normal.  Left shoulder: Normal.  Cervical back: Normal.  Thoracic back: Normal.  Tenderness to palpation along the pectoralis muscle origin along the sternum. Tenderness along the clavicular portion of the left pectoralis muscle Tight pectoralis with arm stretching behind the back Shoulders with negative impingement testing  Neurological: She is alert and oriented to person, place, and time. She has normal strength and normal reflexes. She displays no atrophy. She exhibits normal muscle tone. Coordination and gait normal       Assessment & Plan:  1. Myofascial pain pectoralis muscle left greater than right. Much improved Recommend heat  Recommend D/C physical therapy. Return to clinic PRN Cont HEP Cont prn Advil

## 2012-05-25 NOTE — Patient Instructions (Signed)
Discontinue PT after tomorrow  Continue your home exercises

## 2012-05-26 ENCOUNTER — Ambulatory Visit: Payer: 59 | Attending: Physical Medicine & Rehabilitation | Admitting: Rehabilitation

## 2012-05-26 DIAGNOSIS — R293 Abnormal posture: Secondary | ICD-10-CM | POA: Insufficient documentation

## 2012-05-26 DIAGNOSIS — IMO0001 Reserved for inherently not codable concepts without codable children: Secondary | ICD-10-CM | POA: Insufficient documentation

## 2012-09-21 ENCOUNTER — Ambulatory Visit: Payer: 59

## 2012-09-21 ENCOUNTER — Ambulatory Visit (INDEPENDENT_AMBULATORY_CARE_PROVIDER_SITE_OTHER): Payer: 59 | Admitting: Internal Medicine

## 2012-09-21 VITALS — BP 96/64 | HR 76 | Temp 98.0°F | Resp 16 | Ht 59.0 in | Wt 116.6 lb

## 2012-09-21 DIAGNOSIS — M25539 Pain in unspecified wrist: Secondary | ICD-10-CM

## 2012-09-21 DIAGNOSIS — M25531 Pain in right wrist: Secondary | ICD-10-CM

## 2012-09-21 NOTE — Progress Notes (Signed)
  Subjective:    Patient ID: Colleen Johnson, female    DOB: 1966/01/24, 47 y.o.   MRN: [id number]  HPI  fell on outstretched arm during snow one week ago Continues with pain in the wrist with some swelling No prior injuries No paresthesias    Review of Systems     Objective:   Physical Exam Is swollen on the volar aspect, radial side of the right wrist Tender to palpation Radial deviation painful Flexion and extension painful Tender in the snuff box Tender with movement of the third digit        [facility] reading (PRIMARY) by  Dr.Doolittle=? ND fx navicular.   Assessment & Plan:  Probable wrist sprain Rule out occult navicular injury or ligament injury in the carpal area  Thumb spica splint Refer to hand orthopedics

## 2013-05-27 ENCOUNTER — Other Ambulatory Visit: Payer: Self-pay

## 2014-05-06 ENCOUNTER — Other Ambulatory Visit: Payer: Self-pay

## 2014-09-26 ENCOUNTER — Encounter (HOSPITAL_COMMUNITY): Payer: Self-pay | Admitting: *Deleted

## 2014-09-26 ENCOUNTER — Emergency Department (HOSPITAL_COMMUNITY)
Admission: EM | Admit: 2014-09-26 | Discharge: 2014-09-26 | Disposition: A | Discharge status: Home / Self Care | Payer: 59 | Source: Home / Self Care | Attending: Emergency Medicine | Admitting: Emergency Medicine

## 2014-09-26 DIAGNOSIS — R232 Flushing: Secondary | ICD-10-CM

## 2014-09-26 NOTE — ED Provider Notes (Signed)
CSN: [id number]     Arrival date & time 09/26/14  1216 History   First MD Initiated Contact with Patient 09/26/14 1315     No chief complaint on file.  (Consider location/radiation/quality/duration/timing/severity/associated sxs/prior Treatment) HPI Comments: Patient reports at about 11:50am today she developed a sense flushing and states her skin (diffusely) turned red while at work. This was observed by her co-workers who encouraged her to seek evaluation. States she has had no previous episodes not has she had any new food, medication or environmental exposures. No recent illness or injury. Denies any difficulty breathing, speaking or swallowing. Symptoms lasted about 1 hour and had nearly resolved upon her arrival at the clinic. Menopausal since Sept. 2015 Does not smoke, drink alcohol or use drugs Works for [employer] in [employer].    The history is provided by the patient.    Past Medical History  Diagnosis Date  . Arthritis   . Chicken pox   . [id number](478.2)    Past Surgical History  Procedure Laterality Date  . Joint replacement  06/02/2003    partial   History reviewed. No pertinent family history. History  Substance Use Topics  . Smoking status: Never Smoker   . Smokeless tobacco: Never Used  . Alcohol Use: No   OB History    No data available     Review of Systems  All other systems reviewed and are negative.   Allergies  Codeine; Gabapentin; Morphine and related; Sulfa antibiotics; Tramadol; and Lyrica  Home Medications   Prior to Admission medications   Medication Sig Start Date End Date Taking? Authorizing Provider  Acetaminophen (TYLENOL PO) Take by mouth as needed.    Historical Provider, MD  cetirizine (ZYRTEC) 10 MG tablet Take 10 mg by mouth daily.    Historical Provider, MD  esomeprazole (NEXIUM) 40 MG capsule Take 40 mg by mouth daily before breakfast.    Historical Provider, MD  Ibuprofen (ADVIL PO) Take 1 tablet by mouth 2 (two) times  daily as needed.    Historical Provider, MD   BP 124/68 mmHg  Pulse 68  Temp(Src) 97.6 F (36.4 C) (Oral)  Resp 18  SpO2 100% Physical Exam  Constitutional: She is oriented to person, place, and time. She appears well-developed.  HENT:  Head: Normocephalic and atraumatic.  Right Ear: Hearing, tympanic membrane, external ear and ear canal normal.  Left Ear: Hearing, tympanic membrane, external ear and ear canal normal.  Nose: Nose normal.  Mouth/Throat: Uvula is midline, oropharynx is clear and moist and mucous membranes are normal. No oral lesions. No trismus in the jaw. No uvula swelling. No oropharyngeal exudate.  Eyes: Conjunctivae are normal. No scleral icterus.  Neck: Neck supple. No thyromegaly present.  Cardiovascular: Normal rate, regular rhythm and normal heart sounds.   Pulmonary/Chest: Effort normal and breath sounds normal. No stridor.  Abdominal: Soft. Bowel sounds are normal. She exhibits no distension. There is no tenderness.  Musculoskeletal: Normal range of motion.  Lymphadenopathy:    She has no cervical adenopathy.  Neurological: She is alert and oriented to person, place, and time.  Skin: Skin is warm and dry. No rash noted. No erythema.  Psychiatric: She has a normal mood and affect. Her behavior is normal.  Nursing note and vitals reviewed.   ED Course  Procedures (including critical care time) Labs Review Labs Reviewed - No data to display  Imaging Review No results found.   MDM   1. Flushing   While patient does  not have any significant or worrisome physical findings and vital signs are normal. Does not take niacin supplements. Reviewed list of possible causes ranging from emotional, hormonal and environmental causes to disorders of endocrine system and neoplastic disorders such as carcinoid syndrome. Encouraged patient to pursue close follow up with her PCP should symptoms reoccur. Offered patient benadryl while in clinic and she declined stating her  symptoms have resolved.     Ria ClockJennifer Lee H Mykah Shin, [place] 09/26/14 [phone]

## 2014-09-26 NOTE — Discharge Instructions (Signed)
Please take your zyrtec when you get home and monitor your symptoms closely. Should symptoms reoccur, please arrange evaluation with your primary care doctor. Should symptoms become suddenly worse or severe, please seek immediate assistance at your local emergency room.  Please review printed information provided to you at your visit.

## 2019-04-12 ENCOUNTER — Emergency Department (HOSPITAL_BASED_OUTPATIENT_CLINIC_OR_DEPARTMENT_OTHER): Payer: 59

## 2019-04-12 ENCOUNTER — Other Ambulatory Visit: Payer: Self-pay

## 2019-04-12 ENCOUNTER — Encounter (HOSPITAL_BASED_OUTPATIENT_CLINIC_OR_DEPARTMENT_OTHER): Payer: Self-pay | Admitting: Adult Health

## 2019-04-12 ENCOUNTER — Emergency Department (HOSPITAL_BASED_OUTPATIENT_CLINIC_OR_DEPARTMENT_OTHER)
Admission: EM | Admit: 2019-04-12 | Discharge: 2019-04-12 | Disposition: A | Discharge status: Home / Self Care | Payer: 59 | Attending: Emergency Medicine | Admitting: Emergency Medicine

## 2019-04-12 DIAGNOSIS — Z882 Allergy status to sulfonamides status: Secondary | ICD-10-CM | POA: Diagnosis not present

## 2019-04-12 DIAGNOSIS — Z888 Allergy status to other drugs, medicaments and biological substances status: Secondary | ICD-10-CM | POA: Insufficient documentation

## 2019-04-12 DIAGNOSIS — Z885 Allergy status to narcotic agent status: Secondary | ICD-10-CM | POA: Diagnosis not present

## 2019-04-12 DIAGNOSIS — M79672 Pain in left foot: Secondary | ICD-10-CM | POA: Insufficient documentation

## 2019-04-12 MED ORDER — MELOXICAM 7.5 MG PO TABS: 7.5000 mg | ORAL_TABLET | Freq: Every day | ORAL | 0 refills | Status: DC

## 2019-04-12 NOTE — Discharge Instructions (Signed)
Your x-rays today were reassuring.  As we discussed, continue icing and elevating the foot to help with pain and swelling.  Take Mobic as directed.  Follow-up with either podiatry with orthopedic if symptoms do not improve in 1 to 2 weeks.  Return the emergency department for any worsening pain, redness or swelling of foot, numbness/weakness, fevers or any other worsening or concerning symptoms.

## 2019-04-12 NOTE — ED Triage Notes (Signed)
Presents with left foot pain that began 4 days ago, endorses swelling. States it started with a really bad cramp.  Deneis injury

## 2019-04-12 NOTE — ED Notes (Signed)
Pt refused post op shoe

## 2019-04-12 NOTE — ED Provider Notes (Signed)
[facility] EMERGENCY DEPARTMENT Provider Note   CSN: [id number] Arrival date & time: 04/12/19  1908     History   Chief Complaint Chief Complaint  Patient presents with   Foot Pain    HPI Colleen Johnson is a 53 y.o. female who presents for evaluation of left-sided foot pain and swelling that began about 3 to 4 days ago.  She denies any preceding trauma, injury.  She does report that she had a cramp and states that she hit the foot several times.  Afterwards, she noticed some swelling to the dorsal aspect and pain.  She reports most of her pain is located over the third, fourth, fifth metatarsal.  She states he has been able to ambulate but with some worsening pain.  She has been taking ibuprofen and minimal improvement.  Additionally, she has been doing some ice elevation with no improvement in swelling.  She denies any fevers, overlying warmth, erythema, numbness/weakness.     The history is provided by the patient.    Past Medical History:  Diagnosis Date   Arthritis    Chicken pox    Headache(784.0)     Patient Active Problem List   Diagnosis Date Noted   GERD (gastroesophageal reflux disease) 05/07/2012   Myalgia and myositis, unspecified 04/21/2012   Chest pain 04/08/2012    Past Surgical History:  Procedure Laterality Date   JOINT REPLACEMENT  06/02/2003   partial     OB History   No obstetric history on file.      Home Medications    Prior to Admission medications   Medication Sig Start Date End Date Taking? Authorizing Provider  Acetaminophen (TYLENOL PO) Take by mouth as needed.    [provider]  cetirizine (ZYRTEC) 10 MG tablet Take 10 mg by mouth daily.    [provider]  esomeprazole (NEXIUM) 40 MG capsule Take 40 mg by mouth daily before breakfast.    [provider]  Ibuprofen (ADVIL PO) Take 1 tablet by mouth 2 (two) times daily as needed.    [provider]  meloxicam (MOBIC)  7.5 MG tablet Take 1 tablet (7.5 mg total) by mouth daily. 04/12/19   Maxwell Caul, PA-C    Family History History reviewed. No pertinent family history.  Social History Social History   Tobacco Use   Smoking status: Never Smoker   Smokeless tobacco: Never Used  Substance Use Topics   Alcohol use: No   Drug use: No     Allergies   Codeine, Gabapentin, Morphine and related, Sulfa antibiotics, Tramadol, and Lyrica [pregabalin]   Review of Systems Review of Systems  Constitutional: Negative for fever.  Musculoskeletal:       Left foot pain and swelling.  Skin: Negative for color change.  Neurological: Negative for weakness and numbness.  All other systems reviewed and are negative.    Physical Exam Updated Vital Signs BP 118/84    Pulse 72    Temp 98.2 F (36.8 C)    Resp 18    LMP 09/16/2012    SpO2 100%   Physical Exam Vitals signs and nursing note reviewed.  Constitutional:      Appearance: She is well-developed.  HENT:     Head: Normocephalic and atraumatic.  Eyes:     General: No scleral icterus.       Right eye: No discharge.        Left eye: No discharge.     Conjunctiva/sclera:  Conjunctivae normal.  Cardiovascular:     Pulses:          Dorsalis pedis pulses are 2+ on the right side and 2+ on the left side.  Pulmonary:     Effort: Pulmonary effort is normal.  Musculoskeletal:     Comments: Tenderness palpation dorsal aspect of left foot particularly at the third, fourth, fifth metatarsal.  No deformity or crepitus noted.  There is very minimal overlying soft tissue swelling.  The foot itself is soft without any tension.  Compartments are soft.  No edema, warmth, erythema.  No bony deformity or crepitus noted.  No tenderness palpation noted to left ankle, left tib-fib, left knee.  No deficits noted with palpation of Achilles tendon.  Dorsiflexion and plantarflexion intact.  She can wiggle all 5 toes without any difficulty.  No tenderness palpation  noted to right lower extremity.  Skin:    General: Skin is warm and dry.     Capillary Refill: Capillary refill takes less than 2 seconds.     Comments: Good distal cap refill.  LLE is not dusky in appearance or cool to touch.  Neurological:     Mental Status: She is alert.  Psychiatric:        Speech: Speech normal.        Behavior: Behavior normal.      ED Treatments / Results  Labs (all labs ordered are listed, but only abnormal results are displayed) Labs Reviewed - No data to display  EKG None  Radiology Dg Foot Complete Left  Result Date: 04/12/2019 CLINICAL DATA:  Pain and swelling EXAM: LEFT FOOT - COMPLETE 3+ VIEW COMPARISON:  None. FINDINGS: Frontal, oblique, and lateral views were obtained. There is soft tissue swelling dorsally. No fracture or dislocation. Joint spaces appear normal. No erosive change. There is an accessory ossicle lateral to the cuboid. IMPRESSION: Soft tissue swelling dorsally. No fracture or dislocation. No evident arthropathy. Electronically Signed   By: Bretta Bang III M.D.   On: 04/12/2019 19:35    Procedures Procedures (including critical care time)  Medications Ordered in ED Medications - No data to display   Initial Impression / Assessment and Plan / ED Course  I have reviewed the triage vital signs and the nursing notes.  Pertinent labs & imaging results that were available during my care of the patient were reviewed by me and considered in my medical decision making (see chart for details).        53 year old female who presents for evaluation of left foot pain x3 to 4 days.  No preceding trauma, injury, fall.  Does report that she has some swelling over the foot. Patient is afebrile, non-toxic appearing, sitting comfortably on examination table. Vital signs reviewed and stable. Patient is neurovascularly intact.  On exam, she has tenderness palpation in the third, fourth, fifth metatarsal.  There is some very minimal overlying  swelling but appears almost at baseline when compared to her other foot.  No overlying warmth, erythema.  History/physical exam are not concerning for cellulitis, septic arthritis, acute arterial embolism/ischemic leg, DVT, comparment syndrome.  Doubt gout since there is no overlying warmth, erythema.  Question if she had some injury that she does not know of versus inflammatory process.  X-rays ordered at triage.  X-rays reviewed.  No evidence of acute bony abnormality.  There is some soft tissue swelling dorsally.  No evidence of acute fracture.  Discussed results with patient.  At this time, she is afebrile and  has no overlying warmth, erythema that would be me concern for infectious process.  Do not feel that she needs acute antibiotics.  We will plan to do supportive care measures with movement.  Additionally, patient is requesting Ace bandage and postop shoe.  Instructed her to follow-up with podiatry or orthopedic if symptoms do not improve in 1 to 2 weeks.  Instructed patient to return emergency department for any worsening concerning symptoms. At this time, patient exhibits no emergent life-threatening condition that require further evaluation in ED or admission. Patient had ample opportunity for questions and discussion. All patient's questions were answered with full understanding. Strict return precautions discussed. Patient expresses understanding and agreement to plan.   Portions of this note were generated with [employer]. Dictation errors may occur despite best attempts at proofreading.  Final Clinical Impressions(s) / ED Diagnoses   Final diagnoses:  Foot pain, left    ED Discharge Orders         Ordered    meloxicam (MOBIC) 7.5 MG tablet  Daily     04/12/19 2105           Desma Mcgregor 04/12/19 2203    Tegeler, Gwenyth Allegra, MD 04/13/19 [phone]

## 2019-06-03 ENCOUNTER — Encounter (HOSPITAL_BASED_OUTPATIENT_CLINIC_OR_DEPARTMENT_OTHER): Payer: Self-pay | Admitting: Adult Health

## 2019-06-03 ENCOUNTER — Other Ambulatory Visit: Payer: Self-pay

## 2019-06-03 ENCOUNTER — Emergency Department (HOSPITAL_COMMUNITY)
Admission: EM | Admit: 2019-06-03 | Discharge: 2019-06-03 | Disposition: A | Discharge status: Home / Self Care | Payer: No Typology Code available for payment source | Attending: Emergency Medicine | Admitting: Emergency Medicine

## 2019-06-03 ENCOUNTER — Emergency Department (HOSPITAL_COMMUNITY): Payer: No Typology Code available for payment source

## 2019-06-03 ENCOUNTER — Encounter (HOSPITAL_COMMUNITY): Payer: Self-pay | Admitting: *Deleted

## 2019-06-03 DIAGNOSIS — S50311A Abrasion of right elbow, initial encounter: Secondary | ICD-10-CM | POA: Insufficient documentation

## 2019-06-03 DIAGNOSIS — Y999 Unspecified external cause status: Secondary | ICD-10-CM | POA: Diagnosis not present

## 2019-06-03 DIAGNOSIS — S29019A Strain of muscle and tendon of unspecified wall of thorax, initial encounter: Secondary | ICD-10-CM | POA: Diagnosis not present

## 2019-06-03 DIAGNOSIS — Y9301 Activity, walking, marching and hiking: Secondary | ICD-10-CM | POA: Diagnosis not present

## 2019-06-03 DIAGNOSIS — Y9289 Other specified places as the place of occurrence of the external cause: Secondary | ICD-10-CM | POA: Diagnosis not present

## 2019-06-03 DIAGNOSIS — S199XXA Unspecified injury of neck, initial encounter: Secondary | ICD-10-CM | POA: Diagnosis present

## 2019-06-03 DIAGNOSIS — S5001XA Contusion of right elbow, initial encounter: Secondary | ICD-10-CM | POA: Insufficient documentation

## 2019-06-03 DIAGNOSIS — S161XXA Strain of muscle, fascia and tendon at neck level, initial encounter: Secondary | ICD-10-CM | POA: Insufficient documentation

## 2019-06-03 LAB — URINALYSIS, ROUTINE W REFLEX MICROSCOPIC
Bacteria, UA: NONE SEEN
Bilirubin Urine: NEGATIVE
Glucose, UA: NEGATIVE mg/dL
Ketones, ur: 5 mg/dL — AB
Leukocytes,Ua: NEGATIVE
Nitrite: NEGATIVE
Protein, ur: NEGATIVE mg/dL
Specific Gravity, Urine: 1.013 (ref 1.005–1.030)
pH: 5 (ref 5.0–8.0)

## 2019-06-03 LAB — CBC
HCT: 39.2 % (ref 36.0–46.0)
Hemoglobin: 12.8 g/dL (ref 12.0–15.0)
MCH: 28.4 pg (ref 26.0–34.0)
MCHC: 32.7 g/dL (ref 30.0–36.0)
MCV: 86.9 fL (ref 80.0–100.0)
Platelets: 299 10*3/uL (ref 150–400)
RBC: 4.51 MIL/uL (ref 3.87–5.11)
RDW: 12.7 % (ref 11.5–15.5)
WBC: 6 10*3/uL (ref 4.0–10.5)
nRBC: 0 % (ref 0.0–0.2)

## 2019-06-03 LAB — PROTIME-INR
INR: 1.1 (ref 0.8–1.2)
Prothrombin Time: 14 seconds (ref 11.4–15.2)

## 2019-06-03 LAB — COMPREHENSIVE METABOLIC PANEL
ALT: 26 U/L (ref 0–44)
AST: 30 U/L (ref 15–41)
Albumin: 4 g/dL (ref 3.5–5.0)
Alkaline Phosphatase: 69 U/L (ref 38–126)
Anion gap: 10 (ref 5–15)
BUN: 12 mg/dL (ref 6–20)
CO2: 23 mmol/L (ref 22–32)
Calcium: 9.2 mg/dL (ref 8.9–10.3)
Chloride: 105 mmol/L (ref 98–111)
Creatinine, Ser: 0.98 mg/dL (ref 0.44–1.00)
GFR calc Af Amer: >60 mL/min (ref >60)
GFR calc non Af Amer: >60 mL/min (ref >60)
Glucose, Bld: 101 mg/dL — ABNORMAL HIGH (ref 70–99)
Potassium: 3.4 mmol/L — ABNORMAL LOW (ref 3.5–5.1)
Sodium: 138 mmol/L (ref 135–145)
Total Bilirubin: 0.8 mg/dL (ref 0.3–1.2)
Total Protein: 6.4 g/dL — ABNORMAL LOW (ref 6.5–8.1)

## 2019-06-03 LAB — LACTIC ACID, PLASMA: Lactic Acid, Venous: 1 mmol/L (ref 0.5–1.9)

## 2019-06-03 LAB — ETHANOL: Alcohol, Ethyl (B): <10 mg/dL (ref <10)

## 2019-06-03 LAB — SAMPLE TO BLOOD BANK

## 2019-06-03 LAB — I-STAT CREATININE, ED: Creatinine, Ser: 0.9 mg/dL (ref 0.44–1.00)

## 2019-06-03 MED ORDER — METHOCARBAMOL 500 MG PO TABS: 500.0000 mg | ORAL_TABLET | Freq: Three times a day (TID) | ORAL | 0 refills | Status: DC | PRN

## 2019-06-03 MED ORDER — SODIUM CHLORIDE 0.9 % IV BOLUS
1000.0000 mL | Freq: Once | INTRAVENOUS | Status: AC
  Administered 2019-06-03: 1000 mL via INTRAVENOUS

## 2019-06-03 MED ORDER — IOHEXOL 300 MG/ML  SOLN
100.0000 mL | Freq: Once | INTRAMUSCULAR | Status: AC | PRN
  Administered 2019-06-03: 100 mL via INTRAVENOUS

## 2019-06-03 MED ORDER — IBUPROFEN 800 MG PO TABS: 800.0000 mg | ORAL_TABLET | Freq: Three times a day (TID) | ORAL | 0 refills | Status: DC | PRN

## 2019-06-03 MED ORDER — DICLOFENAC SODIUM 1 % EX GEL: 2.0000 g | Freq: Four times a day (QID) | CUTANEOUS | 0 refills | Status: DC | PRN

## 2019-06-03 NOTE — Discharge Instructions (Signed)
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
## 2019-06-03 NOTE — ED Notes (Signed)
Transported to CT 

## 2019-06-03 NOTE — ED Notes (Signed)
Urine drug screen done by phle[employer]. For her job.

## 2019-06-03 NOTE — ED Notes (Signed)
C-collar removed per Dr. Laverta Baltimore. Food and drink provided for PO challenge.
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
## 2019-06-03 NOTE — ED Notes (Signed)
Pt ambulated in hallway. Tolerated well. °
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
## 2021-03-31 ENCOUNTER — Emergency Department (HOSPITAL_BASED_OUTPATIENT_CLINIC_OR_DEPARTMENT_OTHER): Payer: 59

## 2021-03-31 ENCOUNTER — Other Ambulatory Visit: Payer: Self-pay

## 2021-03-31 ENCOUNTER — Encounter (HOSPITAL_BASED_OUTPATIENT_CLINIC_OR_DEPARTMENT_OTHER): Payer: Self-pay | Admitting: Emergency Medicine

## 2021-03-31 ENCOUNTER — Emergency Department (HOSPITAL_BASED_OUTPATIENT_CLINIC_OR_DEPARTMENT_OTHER)
Admission: EM | Admit: 2021-03-31 | Discharge: 2021-03-31 | Disposition: A | Discharge status: Home / Self Care | Payer: 59 | Attending: Emergency Medicine | Admitting: Emergency Medicine

## 2021-03-31 DIAGNOSIS — S42202A Unspecified fracture of upper end of left humerus, initial encounter for closed fracture: Secondary | ICD-10-CM | POA: Diagnosis not present

## 2021-03-31 DIAGNOSIS — W010XXA Fall on same level from slipping, tripping and stumbling without subsequent striking against object, initial encounter: Secondary | ICD-10-CM | POA: Diagnosis not present

## 2021-03-31 DIAGNOSIS — M25512 Pain in left shoulder: Secondary | ICD-10-CM | POA: Diagnosis not present

## 2021-03-31 DIAGNOSIS — Z969 Presence of functional implant, unspecified: Secondary | ICD-10-CM | POA: Diagnosis not present

## 2021-03-31 DIAGNOSIS — S4992XA Unspecified injury of left shoulder and upper arm, initial encounter: Secondary | ICD-10-CM | POA: Diagnosis present

## 2021-03-31 MED ORDER — ONDANSETRON 4 MG PO TBDP: 4.0000 mg | ORAL_TABLET | Freq: Three times a day (TID) | ORAL | 0 refills | Status: DC | PRN

## 2021-03-31 MED ORDER — DIAZEPAM 5 MG PO TABS
5.0000 mg | ORAL_TABLET | Freq: Once | ORAL | Status: AC
  Administered 2021-03-31: 5 mg via ORAL
  Filled 2021-03-31: qty 1

## 2021-03-31 MED ORDER — METHOCARBAMOL 500 MG PO TABS: 500.0000 mg | ORAL_TABLET | Freq: Four times a day (QID) | ORAL | 0 refills | Status: DC

## 2021-03-31 MED ORDER — OXYCODONE HCL 5 MG PO TABS: 5.0000 mg | ORAL_TABLET | Freq: Four times a day (QID) | ORAL | 0 refills | Status: DC | PRN

## 2021-03-31 NOTE — ED Provider Notes (Addendum)
[facility] EMERGENCY DEPARTMENT Provider Note   CSN: [id number] Arrival date & time: 03/31/21  1018     History Chief Complaint  Patient presents with   Shoulder Injury    left    Colleen Johnson is a 55 y.o. female.  Patient presents the emergency department for evaluation of pain starting acutely just prior to arrival after slip and fall on wet grass.  Patient states that she was trying to control 2 dogs on leashes when 1 got away and the other pulled her, causing her to slip on wet grass.  She landed on her left arm which was extended.  Patient complains of pain from the medial shoulder down to the forearm.  Patient took ibuprofen 600 mg without improvement.  No numbness or tingling.  Patient has difficulty with movement of the arm due to pain significant neck pain, back pain, lower extremity pain.       Past Medical History:  Diagnosis Date   Arthritis    Chicken pox    Headache(784.0)     Patient Active Problem List   Diagnosis Date Noted   GERD (gastroesophageal reflux disease) 05/07/2012   Myalgia and myositis, unspecified 04/21/2012   Chest pain 04/08/2012    Past Surgical History:  Procedure Laterality Date   JOINT REPLACEMENT  06/02/2003   partial     OB History   No obstetric history on file.     No family history on file.  Social History   Tobacco Use   Smoking status: Never   Smokeless tobacco: Never  Substance Use Topics   Alcohol use: Never   Drug use: Never    Home Medications Prior to Admission medications   Medication Sig Start Date End Date Taking? Authorizing Provider  Acetaminophen (TYLENOL PO) Take by mouth as needed.    [provider]  cetirizine (ZYRTEC) 10 MG tablet Take 10 mg by mouth daily.    [provider]  diclofenac Sodium (VOLTAREN) 1 % GEL Apply 2 g topically 4 (four) times daily as needed. 06/03/19   Long, Arlyss Repress, MD  esomeprazole (NEXIUM) 40 MG capsule Take 40 mg by mouth daily  before breakfast.    [provider]  Ibuprofen (ADVIL PO) Take 1 tablet by mouth 2 (two) times daily as needed.    [provider]  ibuprofen (ADVIL) 800 MG tablet Take 1 tablet (800 mg total) by mouth every 8 (eight) hours as needed for moderate pain. 06/03/19   Long, Arlyss Repress, MD  meloxicam (MOBIC) 7.5 MG tablet Take 1 tablet (7.5 mg total) by mouth daily. 04/12/19   Maxwell Caul, PA-C  methocarbamol (ROBAXIN) 500 MG tablet Take 1 tablet (500 mg total) by mouth every 8 (eight) hours as needed for muscle spasms. 06/03/19   Long, Arlyss Repress, MD    Allergies    Codeine, Gabapentin, Morphine and related, Sulfa antibiotics, Tramadol, and Lyrica [pregabalin]  Review of Systems   Review of Systems  Constitutional:  Negative for activity change.  Musculoskeletal:  Positive for arthralgias. Negative for back pain, joint swelling and neck pain.  Skin:  Negative for wound.  Neurological:  Negative for weakness and numbness.   Physical Exam Updated Vital Signs BP 100/68 (BP Location: Right Arm)   Pulse 71   Temp 97.9 F (36.6 C) (Oral)   Resp 20   Ht 5' (1.524 m)   Wt 61.2 kg   LMP 09/16/2012   SpO2 99%   BMI  26.37 kg/m   Physical Exam Vitals and nursing note reviewed.  Constitutional:      Appearance: She is well-developed.  HENT:     Head: Normocephalic and atraumatic.  Eyes:     Pupils: Pupils are equal, round, and reactive to light.  Cardiovascular:     Pulses: Normal pulses. No decreased pulses.  Musculoskeletal:        General: Tenderness present.     Left shoulder: Tenderness and bony tenderness present. No deformity. Decreased range of motion.     Left upper arm: Tenderness present. No swelling.     Left elbow: Normal range of motion. No tenderness.     Left forearm: No tenderness or bony tenderness.     Cervical back: Normal range of motion and neck supple.  Skin:    General: Skin is warm and dry.  Neurological:     Mental Status: She is alert.      Sensory: No sensory deficit.     Comments: Motor, sensation, and vascular distal to the injury is fully intact.   Psychiatric:        Mood and Affect: Mood normal.    ED Results / Procedures / Treatments   Labs (all labs ordered are listed, but only abnormal results are displayed) Labs Reviewed - No data to display  EKG None  Radiology DG Humerus Left  Result Date: 03/31/2021 CLINICAL DATA:  Fall, pain to LEFT shoulder. Decreased range of motion. EXAM: LEFT HUMERUS - 2+ VIEW COMPARISON:  Shoulder evaluation from 2018. FINDINGS: Fracture of the LEFT proximal humerus likely involving humeral neck and greater tuberosity. Mild displacement of greater tuberosity. No additional humeral fracture.  Shoulder is grossly located. IMPRESSION: Fracture of the LEFT proximal humerus likely involving humeral neck and greater tuberosity. Dedicated shoulder evaluation may be helpful for improved detail given the fracture involves the humeral head. Electronically Signed   By: Donzetta Kohut M.D.   On: 03/31/2021 11:17    Procedures Procedures   Medications Ordered in ED Medications  diazepam (VALIUM) tablet 5 mg (5 mg Oral Given 03/31/21 1104)    ED Course  I have reviewed the triage vital signs and the nursing notes.  Pertinent labs & imaging results that were available during my care of the patient were reviewed by me and considered in my medical decision making (see chart for details).  Patient seen and examined. Work-up initiated. Offered pain medication, patient declines.   Vital signs reviewed and are as follows: BP 100/68 (BP Location: Right Arm)   Pulse 71   Temp 97.9 F (36.6 C) (Oral)   Resp 20   Ht 5' (1.524 m)   Wt 61.2 kg   LMP 09/16/2012   SpO2 99%   BMI 26.37 kg/m   11:30 AM patient accepts oral Valium for pain.  X-ray reviewed.  Demonstrates proximal humerus fracture.  Patient placed in a shoulder immobilizer.  Upper extremity remains neurovascularly intact.  We  reviewed results.  Will refer to Dr. Jena Gauss who is on-call today for orthopedic surgery.  Discussed use of pain medication at home.  Discussed rice protocol.  Patient counseled on use of narcotic pain medications. Counseled not to combine these medications with others containing tylenol. Urged not to drink alcohol, drive, or perform any other activities that requires focus while taking these medications. The patient verbalizes understanding and agrees with the plan.  11:45 AM Initial pharmacy was closed. Sent to [facility]. I attempted to call [facility]. Unable to leave a message  to request canceling previous rx.      MDM Rules/Calculators/A&P                           Patient with proximal humerus fracture.  Neurovascularly intact distally.   Final Clinical Impression(s) / ED Diagnoses Final diagnoses:  Closed fracture of proximal end of left humerus, unspecified fracture morphology, initial encounter    Rx / DC Orders ED Discharge Orders          Ordered    oxyCODONE (OXY IR/ROXICODONE) 5 MG immediate release tablet  Every 6 hours PRN        03/31/21 1129    ondansetron (ZOFRAN ODT) 4 MG disintegrating tablet  Every 8 hours PRN        03/31/21 1129    methocarbamol (ROBAXIN) 500 MG tablet  4 times daily        03/31/21 1129             Renne Crigler, PA-C 03/31/21 1134    Renne Crigler, PA-C 03/31/21 1146    Curatolo, Adam, DO 03/31/21 1158

## 2021-03-31 NOTE — Discharge Instructions (Addendum)
Please read and follow all provided instructions.  Your diagnoses today include:  1. Closed fracture of proximal end of left humerus, unspecified fracture morphology, initial encounter     Tests performed today include: An x-ray of the affected area - shows a proximal humerus fracture as we discussed Vital signs. See below for your results today.   Medications prescribed:  Oxycodone - narcotic pain medication  DO NOT drive or perform any activities that require you to be awake and alert because this medicine can make you drowsy.   Zofran (ondansetron) - for nausea and vomiting  Robaxin (methocarbamol) - muscle relaxer medication  DO NOT drive or perform any activities that require you to be awake and alert because this medicine can make you drowsy.   Take any prescribed medications only as directed.  Home care instructions:  Follow any educational materials contained in this packet Follow R.I.C.E. Protocol: R - rest your injury  I  - use ice on injury without applying directly to skin C - compress injury with bandage or splint E - elevate the injury as much as possible  Follow-up instructions: Please follow-up with your primary care provider or the provided orthopedic physician (bone specialist) if you continue to have significant pain in 1 week. In this case you may have a more severe injury that requires further care.   Return instructions:  Please return if your fingers are numb or tingling, appear gray or blue, or you have severe pain (also elevate the arm and loosen splint or wrap if you were given one) Please return to the Emergency Department if you experience worsening symptoms.  Please return if you have any other emergent concerns.  Additional Information:  Your vital signs today were: BP 101/77   Pulse 65   Temp 97.9 F (36.6 C) (Oral)   Resp 20   Ht 5' (1.524 m)   Wt 61.2 kg   LMP 09/16/2012   SpO2 99%   BMI 26.37 kg/m  If your blood pressure (BP) was  elevated above 135/85 this visit, please have this repeated by your doctor within one month. --------------

## 2021-03-31 NOTE — ED Triage Notes (Signed)
Fall , left shoulder and arm injury. No ROM.  Possible dislocation

## 2021-03-31 NOTE — ED Notes (Signed)
Patient transported to X-ray 

## 2023-03-07 LAB — COLOGUARD: COLOGUARD: NEGATIVE

## 2023-07-04 ENCOUNTER — Emergency Department (HOSPITAL_BASED_OUTPATIENT_CLINIC_OR_DEPARTMENT_OTHER)
Admission: EM | Admit: 2023-07-04 | Discharge: 2023-07-05 | Discharge status: Against Medical Advice | Payer: 59 | Attending: Emergency Medicine | Admitting: Emergency Medicine

## 2023-07-04 ENCOUNTER — Encounter (HOSPITAL_BASED_OUTPATIENT_CLINIC_OR_DEPARTMENT_OTHER): Payer: Self-pay

## 2023-07-04 ENCOUNTER — Other Ambulatory Visit: Payer: Self-pay

## 2023-07-04 DIAGNOSIS — R799 Abnormal finding of blood chemistry, unspecified: Secondary | ICD-10-CM | POA: Insufficient documentation

## 2023-07-04 DIAGNOSIS — Z5321 Procedure and treatment not carried out due to patient leaving prior to being seen by health care provider: Secondary | ICD-10-CM | POA: Insufficient documentation

## 2023-07-04 LAB — URINALYSIS, ROUTINE W REFLEX MICROSCOPIC
Bilirubin Urine: NEGATIVE
Glucose, UA: NEGATIVE mg/dL
Ketones, ur: NEGATIVE mg/dL
Leukocytes,Ua: NEGATIVE
Nitrite: NEGATIVE
Protein, ur: NEGATIVE mg/dL
Specific Gravity, Urine: >=1.03 (ref 1.005–1.030)
pH: 6 (ref 5.0–8.0)

## 2023-07-04 LAB — URINALYSIS, MICROSCOPIC (REFLEX)

## 2023-07-04 NOTE — ED Triage Notes (Signed)
PT to triage c/o UTI symptoms x 1 week. Pt was Dx and treated at urgent care and told to return to ER because urine culture was resistant to current PO antibiotic. PT denies fever, dysuria. VSS NAD PT on room air . Culture positive for Escherichia Coli >100,000 per [facility].

## 2023-08-15 ENCOUNTER — Other Ambulatory Visit: Payer: Self-pay | Admitting: Urology

## 2023-08-21 NOTE — Patient Instructions (Signed)
DUE TO COVID-19 ONLY TWO VISITORS  (aged 60 and older)  ARE ALLOWED TO COME WITH YOU AND STAY IN THE WAITING ROOM ONLY DURING PRE OP AND PROCEDURE.   **NO VISITORS ARE ALLOWED IN THE SHORT STAY AREA OR RECOVERY ROOM!!**  IF YOU WILL BE ADMITTED INTO THE HOSPITAL YOU ARE ALLOWED ONLY FOUR SUPPORT PEOPLE DURING VISITATION HOURS ONLY (7 AM -8PM)   The support person(s) must pass our screening, gel in and out, and wear a mask at all times, including in the patient's room. Patients must also wear a mask when staff or their support person are in the room. Visitors GUEST BADGE MUST BE WORN VISIBLY  One adult visitor may remain with you overnight and MUST be in the room by 8 P.M.     Your procedure is scheduled on: 08/23/23   Report to [facility] Main Entrance    Report to admitting at : 1:15 PM   Call this number if you have problems the morning of surgery [phone]   Do not eat food :After Midnight.   After Midnight you may have the following liquids until : 12:30 PM DAY OF SURGERY  Water Black Coffee (sugar ok, NO MILK/CREAM OR CREAMERS)  Tea (sugar ok, NO MILK/CREAM OR CREAMERS) regular and decaf                             Plain Jell-O (NO RED)                                           Fruit ices (not with fruit pulp, NO RED)                                     Popsicles (NO RED)                                                                  Juice: apple, WHITE grape, WHITE cranberry Sports drinks like Gatorade (NO RED)              FOLLOW ANY ADDITIONAL PRE OP INSTRUCTIONS YOU RECEIVED FROM YOUR SURGEON'S OFFICE!!!   Oral Hygiene is also important to reduce your risk of infection.                                    Remember - BRUSH YOUR TEETH THE MORNING OF SURGERY WITH YOUR REGULAR TOOTHPASTE  DENTURES WILL BE REMOVED PRIOR TO SURGERY PLEASE DO NOT APPLY "Poly grip" OR ADHESIVES!!!   Do NOT smoke after Midnight   Take these medicines the morning of surgery with A  SIP OF WATER: NONE.Tylenol as needed.   Stop vitamins,supplements and over the counter medicines 7 days before surgery.                   You may not have any metal on your body including hair pins, jewelry, and body piercing  Do not wear make-up, lotions, powders, perfumes/cologne, or deodorant  Do not wear nail polish including gel and S&S, artificial/acrylic nails, or any other type of covering on natural nails including finger and toenails. If you have artificial nails, gel coating, etc. that needs to be removed by a nail salon please have this removed prior to surgery or surgery may need to be canceled/ delayed if the surgeon/ anesthesia feels like they are unable to be safely monitored.   Do not shave  48 hours prior to surgery.    Do not bring valuables to the hospital. [facility] IS NOT             RESPONSIBLE   FOR VALUABLES.   Contacts, glasses, or bridgework may not be worn into surgery.   Bring small overnight bag day of surgery.   DO NOT BRING YOUR HOME MEDICATIONS TO THE HOSPITAL. PHARMACY WILL DISPENSE MEDICATIONS LISTED ON YOUR MEDICATION LIST TO YOU DURING YOUR ADMISSION IN THE HOSPITAL!    Patients discharged on the day of surgery will not be allowed to drive home.  Someone NEEDS to stay with you for the first 24 hours after anesthesia.   Special Instructions: Bring a copy of your healthcare power of attorney and living will documents         the day of surgery if you haven't scanned them before.              Please read over the following fact sheets you were given: IF YOU HAVE QUESTIONS ABOUT YOUR PRE-OP INSTRUCTIONS PLEASE CALL [phone]    [facility] - Preparing for Surgery Before surgery, you can play an important role.  Because skin is not sterile, your skin needs to be as free of germs as possible.  You can reduce the number of germs on your skin by washing with CHG (chlorahexidine gluconate) soap before surgery.  CHG is an antiseptic cleaner  which kills germs and bonds with the skin to continue killing germs even after washing. Please DO NOT use if you have an allergy to CHG or antibacterial soaps.  If your skin becomes reddened/irritated stop using the CHG and inform your nurse when you arrive at Short Stay. Do not shave (including legs and underarms) for at least 48 hours prior to the first CHG shower.  You may shave your face/neck. Please follow these instructions carefully:  1.  Shower with CHG Soap the night before surgery and the  morning of Surgery.  2.  If you choose to wash your hair, wash your hair first as usual with your  normal  shampoo.  3.  After you shampoo, rinse your hair and body thoroughly to remove the  shampoo.                           4.  Use CHG as you would any other liquid soap.  You can apply chg directly  to the skin and wash                       Gently with a scrungie or clean washcloth.  5.  Apply the CHG Soap to your body ONLY FROM THE NECK DOWN.   Do not use on face/ open                           Wound or open sores. Avoid contact with eyes,  ears mouth and genitals (private parts).                       Wash face,  Genitals (private parts) with your normal soap.             6.  Wash thoroughly, paying special attention to the area where your surgery  will be performed.  7.  Thoroughly rinse your body with warm water from the neck down.  8.  DO NOT shower/wash with your normal soap after using and rinsing off  the CHG Soap.                9.  Pat yourself dry with a clean towel.            10.  Wear clean pajamas.            11.  Place clean sheets on your bed the night of your first shower and do not  sleep with pets. Day of Surgery : Do not apply any lotions/deodorants the morning of surgery.  Please wear clean clothes to the hospital/surgery center.  FAILURE TO FOLLOW THESE INSTRUCTIONS MAY RESULT IN THE CANCELLATION OF YOUR SURGERY PATIENT SIGNATURE_________________________________  NURSE  SIGNATURE__________________________________  ________________________________________________________________________

## 2023-08-25 ENCOUNTER — Encounter (HOSPITAL_COMMUNITY)
Admission: RE | Admit: 2023-08-25 | Discharge: 2023-08-25 | Disposition: A | Discharge status: Home / Self Care | Payer: 59 | Source: Ambulatory Visit | Attending: Urology | Admitting: Urology

## 2023-08-25 ENCOUNTER — Other Ambulatory Visit: Payer: Self-pay

## 2023-08-25 ENCOUNTER — Encounter (HOSPITAL_COMMUNITY): Payer: Self-pay

## 2023-08-25 VITALS — BP 108/77 | HR 65 | Temp 98.4°F | Ht 60.0 in | Wt 136.0 lb

## 2023-08-25 DIAGNOSIS — Z01812 Encounter for preprocedural laboratory examination: Secondary | ICD-10-CM | POA: Diagnosis present

## 2023-08-25 DIAGNOSIS — Z01818 Encounter for other preprocedural examination: Secondary | ICD-10-CM

## 2023-08-25 HISTORY — DX: Personal history of urinary calculi: Z87.442

## 2023-08-25 LAB — CBC
HCT: 40.8 % (ref 36.0–46.0)
Hemoglobin: 13.2 g/dL (ref 12.0–15.0)
MCH: 28.8 pg (ref 26.0–34.0)
MCHC: 32.4 g/dL (ref 30.0–36.0)
MCV: 88.9 fL (ref 80.0–100.0)
Platelets: 266 10*3/uL (ref 150–400)
RBC: 4.59 MIL/uL (ref 3.87–5.11)
RDW: 13.1 % (ref 11.5–15.5)
WBC: 4.8 10*3/uL (ref 4.0–10.5)
nRBC: 0 % (ref 0.0–0.2)

## 2023-08-25 NOTE — Progress Notes (Signed)
Pt. Was very upset about presurgical appointment,as per pt. It was unnecessary and nobody before has done those history questions.She refused to answer education questions, because as per her,that is irrelevant and not apply to the surgery.

## 2023-08-25 NOTE — Progress Notes (Signed)
For Anesthesia: PCP -  Chryl Heck Rolly Pancake, FNP    Cardiologist - N/A  Bowel Prep reminder:N/A  Chest x-ray -  EKG -  Stress Test -  ECHO -  Cardiac Cath -  Pacemaker/ICD device last checked: Pacemaker orders received: Device Rep notified:  Spinal Cord Stimulator:N/A  Sleep Study - N/A CPAP -   Fasting Blood Sugar - N/A Checks Blood Sugar _____ times a day Date and result of last Hgb A1c-  Last dose of GLP1 agonist- N/A GLP1 instructions:   Last dose of SGLT-2 inhibitors- N/A SGLT-2 instructions:   Blood Thinner Instructions:N/A Aspirin Instructions: Last Dose:  Activity level: Can go up a flight of stairs and activities of daily living without stopping and without chest pain and/or shortness of breath   Able to exercise without chest pain and/or shortness of breath  Anesthesia review:   Patient denies shortness of breath, fever, cough and chest pain at PAT appointment   Patient verbalized understanding of instructions that were given to them at the PAT appointment. Patient was also instructed that they will need to review over the PAT instructions again at home before surgery.

## 2023-09-10 ENCOUNTER — Encounter (HOSPITAL_COMMUNITY): Admission: RE | Payer: Self-pay | Source: Home / Self Care

## 2023-09-10 ENCOUNTER — Ambulatory Visit (HOSPITAL_COMMUNITY): Admission: RE | Admit: 2023-09-10 | Payer: 59 | Source: Home / Self Care | Admitting: Urology

## 2023-09-10 SURGERY — CYSTOURETEROSCOPY, USING HOLMIUM LASER
Anesthesia: General | Laterality: Bilateral

## 2023-09-17 ENCOUNTER — Other Ambulatory Visit: Payer: Self-pay | Admitting: Urology

## 2023-09-30 NOTE — Progress Notes (Signed)
 COVID Vaccine received:  []  No [x]  Yes Date of any COVID positive Test in last 90 days: no PCP - Oretha Milch FNP Cardiologist -   Chest x-ray -  EKG -   Stress Test -  ECHO -  Cardiac Cath -   Bowel Prep - [x]  No  []   Yes ______  Pacemaker / ICD device [x]  No []  Yes   Spinal Cord Stimulator:[x]  No []  Yes       History of Sleep Apnea? [x]  No []  Yes   CPAP used?- [x]  No []  Yes    Does the patient monitor blood sugar?          [x]  No []  Yes  []  N/A  Patient has: [x]  NO Hx DM   []  Pre-DM                 []  DM1  []   DM2 Does patient have a [employer] or [employer]? []  No []  Yes   Fasting Blood Sugar Ranges-  Checks Blood Sugar _____ times a day  GLP1 agonist / usual dose - no GLP1 instructions:  SGLT-2 inhibitors / usual dose - no SGLT-2 instructions:   Blood Thinner / Instructions:no Aspirin Instructions:no  Comments:   Activity level: Patient is able to climb a flight of stairs without difficulty; [x]  No CP  [x]  No SOB   Patient can /  perform ADLs without assistance.   Anesthesia review:   Patient denies shortness of breath, fever, cough and chest pain at PAT appointment.  Patient verbalized understanding and agreement to the Pre-Surgical Instructions that were given to them at this PAT appointment. Patient was also educated of the need to review these PAT instructions again prior to his/her surgery.I reviewed the appropriate phone numbers to call if they have any and questions or concerns.

## 2023-09-30 NOTE — Patient Instructions (Signed)
 SURGICAL WAITING ROOM VISITATION  Patients having surgery or a procedure may have no more than 2 support people in the waiting area - these visitors may rotate.    Children under the age of 11 must have an adult with them who is not the patient.  Due to an increase in RSV and influenza rates and associated hospitalizations, children ages 30 and under may not visit patients in [facility] hospitals.  Visitors with respiratory illnesses are discouraged from visiting and should remain at home.  If the patient needs to stay at the hospital during part of their recovery, the visitor guidelines for inpatient rooms apply. Pre-op nurse will coordinate an appropriate time for 1 support person to accompany patient in pre-op.  This support person may not rotate.    Please refer to the [facility] website for the visitor guidelines for Inpatients (after your surgery is over and you are in a regular room).       Your procedure is scheduled on: 10/17/23   Report to [facility] Main Entrance    Report to admitting at 1:15 PM   Call this number if you have problems the morning of surgery [phone]   Do not eat food or drink liquids:After Midnight. But may have  sips of water to take meds.           Oral Hygiene is also important to reduce your risk of infection.                                    Remember - BRUSH YOUR TEETH THE MORNING OF SURGERY WITH YOUR REGULAR TOOTHPASTE  DENTURES WILL BE REMOVED PRIOR TO SURGERY PLEASE DO NOT APPLY "Poly grip" OR ADHESIVES!!!   Stop all vitamins and herbal supplements 7 days before surgery.   Take these medicines the morning of surgery with A SIP OF WATER: Tylenol if needed.             You may not have any metal on your body including hair pins, jewelry, and body piercing             Do not wear make-up, lotions, powders, perfumes/cologne, or deodorant  Do not wear nail polish including gel and S&S, artificial/acrylic nails, or any other  type of covering on natural nails including finger and toenails. If you have artificial nails, gel coating, etc. that needs to be removed by a nail salon please have this removed prior to surgery or surgery may need to be canceled/ delayed if the surgeon/ anesthesia feels like they are unable to be safely monitored.   Do not shave  48 hours prior to surgery.    Do not bring valuables to the hospital. [facility] IS NOT             RESPONSIBLE   FOR VALUABLES.   Contacts, glasses, dentures or bridgework may not be worn into surgery.   Bring small overnight bag day of surgery.   DO NOT BRING YOUR HOME MEDICATIONS TO THE HOSPITAL. PHARMACY WILL DISPENSE MEDICATIONS LISTED ON YOUR MEDICATION LIST TO YOU DURING YOUR ADMISSION IN THE HOSPITAL!    Patients discharged on the day of surgery will not be allowed to drive home.  Someone NEEDS to stay with you for the first 24 hours after anesthesia.   Special Instructions: Bring a copy of your healthcare power of attorney and living will documents the day  of surgery if you haven't scanned them before.              Please read over the following fact sheets you were given: IF YOU HAVE QUESTIONS ABOUT YOUR PRE-OP INSTRUCTIONS PLEASE CALL [phone] Rosey Bath   If you received a COVID test during your pre-op visit  it is requested that you wear a mask when out in public, stay away from anyone that may not be feeling well and notify your surgeon if you develop symptoms. If you test positive for Covid or have been in contact with anyone that has tested positive in the last 10 days please notify you surgeon.    [facility] - Preparing for Surgery Before surgery, you can play an important role.  Because skin is not sterile, your skin needs to be as free of germs as possible.  You can reduce the number of germs on your skin by washing with CHG (chlorahexidine gluconate) soap before surgery.  CHG is an antiseptic cleaner which kills germs and bonds with the skin  to continue killing germs even after washing. Please DO NOT use if you have an allergy to CHG or antibacterial soaps.  If your skin becomes reddened/irritated stop using the CHG and inform your nurse when you arrive at Short Stay. Do not shave (including legs and underarms) for at least 48 hours prior to the first CHG shower.  You may shave your face/neck.  Please follow these instructions carefully:  1.  Shower with CHG Soap the night before surgery and the  morning of surgery.  2.  If you choose to wash your hair, wash your hair first as usual with your normal  shampoo.  3.  After you shampoo, rinse your hair and body thoroughly to remove the shampoo.                             4.  Use CHG as you would any other liquid soap.  You can apply chg directly to the skin and wash.  Gently with a scrungie or clean washcloth.  5.  Apply the CHG Soap to your body ONLY FROM THE NECK DOWN.   Do   not use on face/ open                           Wound or open sores. Avoid contact with eyes, ears mouth and   genitals (private parts).                       Wash face,  Genitals (private parts) with your normal soap.             6.  Wash thoroughly, paying special attention to the area where your    surgery  will be performed.  7.  Thoroughly rinse your body with warm water from the neck down.  8.  DO NOT shower/wash with your normal soap after using and rinsing off the CHG Soap.                9.  Pat yourself dry with a clean towel.            10.  Wear clean pajamas.            11.  Place clean sheets on your bed the night of your first shower and do not  sleep with pets. Day  of Surgery : Do not apply any lotions/deodorants the morning of surgery.  Please wear clean clothes to the hospital/surgery center.  FAILURE TO FOLLOW THESE INSTRUCTIONS MAY RESULT IN THE CANCELLATION OF YOUR SURGERY  PATIENT SIGNATURE_________________________________  NURSE  SIGNATURE__________________________________  ________________________________________________________________________

## 2023-10-03 ENCOUNTER — Encounter (HOSPITAL_COMMUNITY)
Admission: RE | Admit: 2023-10-03 | Discharge: 2023-10-03 | Disposition: A | Discharge status: Home / Self Care | Source: Ambulatory Visit | Attending: Urology | Admitting: Urology

## 2023-10-03 ENCOUNTER — Other Ambulatory Visit: Payer: Self-pay

## 2023-10-03 VITALS — BP 116/79 | HR 74 | Temp 98.3°F | Resp 16 | Ht 60.0 in | Wt 138.0 lb

## 2023-10-03 DIAGNOSIS — Z01812 Encounter for preprocedural laboratory examination: Secondary | ICD-10-CM | POA: Diagnosis not present

## 2023-10-03 DIAGNOSIS — Z01818 Encounter for other preprocedural examination: Secondary | ICD-10-CM

## 2023-10-03 LAB — CBC
HCT: 42.7 % (ref 36.0–46.0)
Hemoglobin: 13.5 g/dL (ref 12.0–15.0)
MCH: 28 pg (ref 26.0–34.0)
MCHC: 31.6 g/dL (ref 30.0–36.0)
MCV: 88.4 fL (ref 80.0–100.0)
Platelets: 281 10*3/uL (ref 150–400)
RBC: 4.83 MIL/uL (ref 3.87–5.11)
RDW: 13 % (ref 11.5–15.5)
WBC: 5.7 10*3/uL (ref 4.0–10.5)
nRBC: 0 % (ref 0.0–0.2)

## 2023-10-16 MED ORDER — GENTAMICIN SULFATE 40 MG/ML IJ SOLN
5.0000 mg/kg | INTRAVENOUS | Status: AC
  Administered 2023-10-17: 310 mg via INTRAVENOUS
  Filled 2023-10-16: qty 7.75

## 2023-10-17 ENCOUNTER — Encounter (HOSPITAL_COMMUNITY): Payer: Self-pay | Admitting: Urology

## 2023-10-17 ENCOUNTER — Ambulatory Visit (HOSPITAL_COMMUNITY)
Admission: RE | Admit: 2023-10-17 | Discharge: 2023-10-17 | Disposition: A | Discharge status: Home / Self Care | Payer: 59 | Attending: Urology | Admitting: Urology

## 2023-10-17 ENCOUNTER — Ambulatory Visit (HOSPITAL_COMMUNITY): Admitting: Anesthesiology

## 2023-10-17 ENCOUNTER — Ambulatory Visit (HOSPITAL_COMMUNITY)

## 2023-10-17 ENCOUNTER — Encounter (HOSPITAL_COMMUNITY)
Admission: RE | Disposition: A | Discharge status: Home / Self Care | Payer: Self-pay | Source: Home / Self Care | Attending: Urology

## 2023-10-17 ENCOUNTER — Other Ambulatory Visit: Payer: Self-pay

## 2023-10-17 ENCOUNTER — Ambulatory Visit (HOSPITAL_BASED_OUTPATIENT_CLINIC_OR_DEPARTMENT_OTHER): Admitting: Anesthesiology

## 2023-10-17 DIAGNOSIS — N200 Calculus of kidney: Secondary | ICD-10-CM

## 2023-10-17 DIAGNOSIS — M1990 Unspecified osteoarthritis, unspecified site: Secondary | ICD-10-CM | POA: Insufficient documentation

## 2023-10-17 DIAGNOSIS — Z87442 Personal history of urinary calculi: Secondary | ICD-10-CM | POA: Diagnosis not present

## 2023-10-17 DIAGNOSIS — R519 Headache, unspecified: Secondary | ICD-10-CM | POA: Insufficient documentation

## 2023-10-17 HISTORY — PX: CYSTOSCOPY/URETEROSCOPY/HOLMIUM LASER/STENT PLACEMENT: SHX6546

## 2023-10-17 SURGERY — CYSTOSCOPY/URETEROSCOPY/HOLMIUM LASER/STENT PLACEMENT
Anesthesia: General | Site: Ureter | Laterality: Bilateral

## 2023-10-17 MED ORDER — FENTANYL CITRATE (PF) 100 MCG/2ML IJ SOLN
INTRAMUSCULAR | Status: AC
  Filled 2023-10-17: qty 2

## 2023-10-17 MED ORDER — ONDANSETRON HCL 4 MG/2ML IJ SOLN
INTRAMUSCULAR | Status: DC | PRN
  Administered 2023-10-17: 4 mg via INTRAVENOUS

## 2023-10-17 MED ORDER — SUGAMMADEX SODIUM 200 MG/2ML IV SOLN
INTRAVENOUS | Status: AC
  Filled 2023-10-17: qty 2

## 2023-10-17 MED ORDER — HYDROCODONE-ACETAMINOPHEN 7.5-325 MG PO TABS: 1.0000 | ORAL_TABLET | Freq: Once | ORAL | Status: DC | PRN

## 2023-10-17 MED ORDER — SODIUM CHLORIDE 0.9 % IR SOLN
Status: DC | PRN
  Administered 2023-10-17: 3000 mL

## 2023-10-17 MED ORDER — DEXAMETHASONE SODIUM PHOSPHATE 4 MG/ML IJ SOLN
INTRAMUSCULAR | Status: DC | PRN
  Administered 2023-10-17: 4 mg via INTRAVENOUS

## 2023-10-17 MED ORDER — PROPOFOL 500 MG/50ML IV EMUL
INTRAVENOUS | Status: DC | PRN
Start: 2023-10-17 — End: 2023-10-17
  Administered 2023-10-17: 150 ug/kg/min via INTRAVENOUS

## 2023-10-17 MED ORDER — OXYCODONE-ACETAMINOPHEN 5-325 MG PO TABS
1.0000 | ORAL_TABLET | Freq: Four times a day (QID) | ORAL | 0 refills | Status: AC | PRN
Start: 2023-10-17 — End: 2024-10-16

## 2023-10-17 MED ORDER — ACETAMINOPHEN 500 MG PO TABS
1000.0000 mg | ORAL_TABLET | Freq: Once | ORAL | Status: AC
  Administered 2023-10-17: 1000 mg via ORAL
  Filled 2023-10-17: qty 2

## 2023-10-17 MED ORDER — HYDROMORPHONE HCL 1 MG/ML IJ SOLN
INTRAMUSCULAR | Status: AC
  Administered 2023-10-17: 0.5 mg via INTRAVENOUS
  Filled 2023-10-17: qty 1

## 2023-10-17 MED ORDER — ORAL CARE MOUTH RINSE: 15.0000 mL | Freq: Once | OROMUCOSAL | Status: AC

## 2023-10-17 MED ORDER — FENTANYL CITRATE (PF) 100 MCG/2ML IJ SOLN
INTRAMUSCULAR | Status: DC | PRN
  Administered 2023-10-17: 50 ug via INTRAVENOUS
  Administered 2023-10-17 (×2): 25 ug via INTRAVENOUS

## 2023-10-17 MED ORDER — SUCCINYLCHOLINE CHLORIDE 200 MG/10ML IV SOSY
PREFILLED_SYRINGE | INTRAVENOUS | Status: AC
  Filled 2023-10-17: qty 10

## 2023-10-17 MED ORDER — GLYCOPYRROLATE 0.2 MG/ML IJ SOLN
INTRAMUSCULAR | Status: DC | PRN
  Administered 2023-10-17: .2 mg via INTRAVENOUS

## 2023-10-17 MED ORDER — 0.9 % SODIUM CHLORIDE (POUR BTL) OPTIME
TOPICAL | Status: DC | PRN
  Administered 2023-10-17: 1000 mL

## 2023-10-17 MED ORDER — SOLIFENACIN SUCCINATE 10 MG PO TABS
10.0000 mg | ORAL_TABLET | Freq: Every day | ORAL | 2 refills | Status: AC | PRN
Start: 2023-10-17 — End: 2024-10-16

## 2023-10-17 MED ORDER — SCOPOLAMINE 1 MG/3DAYS TD PT72
1.0000 | MEDICATED_PATCH | TRANSDERMAL | Status: DC
  Administered 2023-10-17: 1.5 mg via TRANSDERMAL
  Filled 2023-10-17: qty 1

## 2023-10-17 MED ORDER — SENNOSIDES-DOCUSATE SODIUM 8.6-50 MG PO TABS: 1.0000 | ORAL_TABLET | Freq: Two times a day (BID) | ORAL | 0 refills | Status: AC

## 2023-10-17 MED ORDER — ROCURONIUM BROMIDE 10 MG/ML (PF) SYRINGE
PREFILLED_SYRINGE | INTRAVENOUS | Status: AC
  Filled 2023-10-17: qty 10

## 2023-10-17 MED ORDER — HYDROMORPHONE HCL 1 MG/ML IJ SOLN
0.2500 mg | INTRAMUSCULAR | Status: DC | PRN
  Administered 2023-10-17: 0.5 mg via INTRAVENOUS

## 2023-10-17 MED ORDER — LIDOCAINE HCL (CARDIAC) PF 100 MG/5ML IV SOSY
PREFILLED_SYRINGE | INTRAVENOUS | Status: DC | PRN
  Administered 2023-10-17: 60 mg via INTRAVENOUS

## 2023-10-17 MED ORDER — AMISULPRIDE (ANTIEMETIC) 5 MG/2ML IV SOLN: 10.0000 mg | Freq: Once | INTRAVENOUS | Status: DC | PRN

## 2023-10-17 MED ORDER — IOHEXOL 300 MG/ML  SOLN
INTRAMUSCULAR | Status: DC | PRN
  Administered 2023-10-17: 10 mL

## 2023-10-17 MED ORDER — PROPOFOL 10 MG/ML IV BOLUS
INTRAVENOUS | Status: AC
  Filled 2023-10-17: qty 20

## 2023-10-17 MED ORDER — PROPOFOL 10 MG/ML IV BOLUS
INTRAVENOUS | Status: DC | PRN
  Administered 2023-10-17: 150 mg via INTRAVENOUS

## 2023-10-17 MED ORDER — CHLORHEXIDINE GLUCONATE 0.12 % MT SOLN
15.0000 mL | Freq: Once | OROMUCOSAL | Status: AC
  Administered 2023-10-17: 15 mL via OROMUCOSAL

## 2023-10-17 MED ORDER — DEXAMETHASONE SODIUM PHOSPHATE 10 MG/ML IJ SOLN
INTRAMUSCULAR | Status: AC
  Filled 2023-10-17: qty 1

## 2023-10-17 MED ORDER — KETOROLAC TROMETHAMINE 30 MG/ML IJ SOLN
INTRAMUSCULAR | Status: AC
  Filled 2023-10-17: qty 1

## 2023-10-17 MED ORDER — PHENYLEPHRINE HCL (PRESSORS) 10 MG/ML IV SOLN
INTRAVENOUS | Status: DC | PRN
  Administered 2023-10-17: 240 ug via INTRAVENOUS

## 2023-10-17 MED ORDER — KETOROLAC TROMETHAMINE 30 MG/ML IJ SOLN
INTRAMUSCULAR | Status: DC | PRN
  Administered 2023-10-17: 30 mg via INTRAVENOUS

## 2023-10-17 MED ORDER — MIDAZOLAM HCL 5 MG/5ML IJ SOLN
INTRAMUSCULAR | Status: DC | PRN
  Administered 2023-10-17: 2 mg via INTRAVENOUS

## 2023-10-17 MED ORDER — PROPOFOL 1000 MG/100ML IV EMUL
INTRAVENOUS | Status: AC
  Filled 2023-10-17: qty 100

## 2023-10-17 MED ORDER — ONDANSETRON HCL 4 MG/2ML IJ SOLN
INTRAMUSCULAR | Status: AC
  Filled 2023-10-17: qty 2

## 2023-10-17 MED ORDER — LIDOCAINE HCL (PF) 2 % IJ SOLN
INTRAMUSCULAR | Status: AC
Start: 2023-10-17 — End: ?
  Filled 2023-10-17: qty 5

## 2023-10-17 MED ORDER — LACTATED RINGERS IV SOLN
INTRAVENOUS | Status: DC
  Administered 2023-10-17: 1000 mL via INTRAVENOUS

## 2023-10-17 MED ORDER — MIDAZOLAM HCL 2 MG/2ML IJ SOLN
INTRAMUSCULAR | Status: AC
  Filled 2023-10-17: qty 2

## 2023-10-17 MED ORDER — KETOROLAC TROMETHAMINE 10 MG PO TABS: 10.0000 mg | ORAL_TABLET | Freq: Three times a day (TID) | ORAL | 0 refills | Status: AC | PRN

## 2023-10-17 MED ORDER — ONDANSETRON HCL 4 MG/2ML IJ SOLN: 4.0000 mg | Freq: Once | INTRAMUSCULAR | Status: DC | PRN

## 2023-10-17 SURGICAL SUPPLY — 20 items
BAG URO CATCHER STRL LF (MISCELLANEOUS) ×2 IMPLANT
BASKET LASER NITINOL 1.9FR (BASKET) IMPLANT
CATH URETL OPEN END 6FR 70 (CATHETERS) ×2 IMPLANT
CLOTH BEACON ORANGE TIMEOUT ST (SAFETY) ×2 IMPLANT
EXTRACTOR STONE 1.7FRX115CM (UROLOGICAL SUPPLIES) IMPLANT
GLOVE SURG LX STRL 7.5 STRW (GLOVE) ×2 IMPLANT
GOWN STRL REUS W/ TWL XL LVL3 (GOWN DISPOSABLE) ×2 IMPLANT
GUIDEWIRE ANG ZIPWIRE 038X150 (WIRE) ×2 IMPLANT
GUIDEWIRE STR DUAL SENSOR (WIRE) ×2 IMPLANT
KIT TURNOVER KIT A (KITS) IMPLANT
LASER FIB FLEXIVA PULSE ID 365 (Laser) IMPLANT
MANIFOLD NEPTUNE II (INSTRUMENTS) ×2 IMPLANT
PACK CYSTO (CUSTOM PROCEDURE TRAY) ×2 IMPLANT
SHEATH NAVIGATOR HD 11/13X28 (SHEATH) IMPLANT
SHEATH NAVIGATOR HD 11/13X36 (SHEATH) IMPLANT
STENT POLARIS 5FRX22 (STENTS) IMPLANT
TRACTIP FLEXIVA PULS ID 200XHI (Laser) IMPLANT
TUBE PU 8FR 16IN ENFIT (TUBING) ×2 IMPLANT
TUBING CONNECTING 10 (TUBING) ×2 IMPLANT
TUBING UROLOGY SET (TUBING) ×2 IMPLANT

## 2023-10-17 NOTE — Anesthesia Postprocedure Evaluation (Signed)
 Anesthesia Post Note  Patient: Colleen Johnson  Procedure(s) Performed: CYSTOSCOPY/BILATERAL DIAGNOSTIC URETEROSCOPY/BILATERAL RETROGRADE PYELOGRAM/BILATERAL STENT PLACEMENT (Bilateral: Ureter)     Patient location during evaluation: PACU Anesthesia Type: General Level of consciousness: awake and alert, oriented and patient cooperative Pain management: pain level controlled Vital Signs Assessment: post-procedure vital signs reviewed and stable Respiratory status: spontaneous breathing, nonlabored ventilation and respiratory function stable Cardiovascular status: blood pressure returned to baseline and stable Postop Assessment: no apparent nausea or vomiting Anesthetic complications: no   No notable events documented.  Last Vitals:  Vitals:   10/17/23 1600 10/17/23 1614  BP: 96/66   Pulse: 61   Resp: 12 15  Temp:  36.4 C  SpO2: 100%     Last Pain:  Vitals:   10/17/23 1600  TempSrc:   PainSc: 3                  Lannie Fields

## 2023-10-17 NOTE — Anesthesia Procedure Notes (Signed)
 Procedure Name: LMA Insertion Date/Time: 10/17/2023 2:22 PM  Performed by: Floydene Flock, CRNAPre-anesthesia Checklist: Patient identified, Emergency Drugs available, Suction available and Patient being monitored Patient Re-evaluated:Patient Re-evaluated prior to induction Oxygen Delivery Method: Circle system utilized Preoxygenation: Pre-oxygenation with 100% oxygen LMA: LMA inserted LMA Size: 3.0 Number of attempts: 1 Placement Confirmation: positive ETCO2 Dental Injury: Teeth and Oropharynx as per pre-operative assessment  Comments: Sore on patients mid lower lip in Short Stay prior to entering the OR

## 2023-10-17 NOTE — Transfer of Care (Signed)
 Immediate Anesthesia Transfer of Care Note  Patient: Colleen Johnson  Procedure(s) Performed: CYSTOSCOPY/BILATERAL DIAGNOSTIC URETEROSCOPY/BILATERAL RETROGRADE PYELOGRAM/BILATERAL STENT PLACEMENT (Bilateral: Ureter)  Patient Location: PACU  Anesthesia Type:General  Level of Consciousness: drowsy  Airway & Oxygen Therapy: Patient Spontanous Breathing and Patient connected to face mask oxygen  Post-op Assessment: Report given to RN and Post -op Vital signs reviewed and stable  Post vital signs: Reviewed and stable  Last Vitals:  Vitals Value Taken Time  BP 114/75 10/17/23 1448  Temp    Pulse 81 10/17/23 1450  Resp 16 10/17/23 1450  SpO2 100 % 10/17/23 1450  Vitals shown include unfiled device data.  Last Pain:  Vitals:   10/17/23 1315  TempSrc: Oral         Complications: No notable events documented.

## 2023-10-17 NOTE — Anesthesia Preprocedure Evaluation (Addendum)
 Anesthesia Evaluation  Patient identified by MRN, date of birth, ID band Patient awake    Reviewed: Allergy & Precautions, NPO status , Patient's Chart, lab work & pertinent test results  Airway Mallampati: III  TM Distance: >3 FB Neck ROM: Full    Dental  (+) Teeth Intact, Dental Advisory Given   Pulmonary neg pulmonary ROS   Pulmonary exam normal breath sounds clear to auscultation       Cardiovascular negative cardio ROS Normal cardiovascular exam Rhythm:Regular Rate:Normal     Neuro/Psych  Headaches  negative psych ROS   GI/Hepatic negative GI ROS, Neg liver ROS,,,  Endo/Other  negative endocrine ROS    Renal/GU Renal disease (b/l renal calculus)  negative genitourinary   Musculoskeletal  (+) Arthritis , Osteoarthritis,    Abdominal   Peds  Hematology negative hematology ROS (+)   Anesthesia Other Findings   Reproductive/Obstetrics negative OB ROS                             Anesthesia Physical Anesthesia Plan  ASA: 2  Anesthesia Plan: General   Post-op Pain Management: Tylenol PO (pre-op)* and Toradol IV (intra-op)*   Induction: Intravenous  PONV Risk Score and Plan: 3 and Ondansetron, Dexamethasone, Propofol infusion, TIVA, Midazolam, Treatment may vary due to age or medical condition and Scopolamine patch - Pre-op  Airway Management Planned: Oral ETT  Additional Equipment: None  Intra-op Plan:   Post-operative Plan: Extubation in OR  Informed Consent: I have reviewed the patients History and Physical, chart, labs and discussed the procedure including the risks, benefits and alternatives for the proposed anesthesia with the patient or authorized representative who has indicated his/her understanding and acceptance.     Dental advisory given  Plan Discussed with: CRNA  Anesthesia Plan Comments: (Pt presented w/ letter from [facility] about her last anesthetic (orthopedic  procedure done under mac/block), as this was the best anesthetic she has had in terms of nausea and brain fog postoperatively. They stated in their note to her, and I reiterated, that not every surgery can be done this way. We will do general anesthesia under TIVA w/ LMA. She understands that this will require a deeper level of anesthesia. )       Anesthesia Quick Evaluation
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
## 2023-10-17 NOTE — Brief Op Note (Signed)
 10/17/2023  2:36 PM  PATIENT:  Lauretta Chester  58 y.o. female  PRE-OPERATIVE DIAGNOSIS:  BILATERAL RENAL CALCULUS  POST-OPERATIVE DIAGNOSIS:  BILATERAL RENAL CALCULUS  PROCEDURE:  Procedure(s) with comments: CYSTOSCOPY/BILATERAL DIAGNOSTIC URETEROSCOPY/BILATERAL RETROGRADE PYELOGRAM/BILATERAL STENT PLACEMENT (Bilateral) - 90 MINUTES NEEDED FOR CASE  SURGEON:  Surgeons and Role:    * Manny, Delbert Phenix., MD - Primary  PHYSICIAN ASSISTANT:   ASSISTANTS: none   ANESTHESIA:   general  EBL:  minimal   BLOOD ADMINISTERED:none  DRAINS: none   LOCAL MEDICATIONS USED:  NONE  SPECIMEN:  No Specimen  DISPOSITION OF SPECIMEN:  N/A  COUNTS:  YES  TOURNIQUET:  * No tourniquets in log *  DICTATION: .Other Dictation: Dictation Number [id number]  PLAN OF CARE: Discharge to home after PACU  PATIENT DISPOSITION:  PACU - hemodynamically stable.   Delay start of Pharmacological VTE agent (>24hrs) due to surgical blood loss or risk of bleeding: yes

## 2023-10-17 NOTE — Discharge Instructions (Signed)
 1 - You may have urinary urgency (bladder spasms) and bloody urine on / off with stent in place. This is normal.  2 - Call MD or go to ER for fever >102, severe pain / nausea / vomiting not relieved by medications, or acute change in medical status

## 2023-10-18 ENCOUNTER — Encounter (HOSPITAL_COMMUNITY): Payer: Self-pay | Admitting: Urology

## 2023-10-20 NOTE — Op Note (Unsigned)
 NAMESKAI, LICKTEIG MEDICAL RECORD NO: [id number] ACCOUNT NO: [id number] DATE OF BIRTH: [birth year] FACILITY: [facility] LOCATION: [facility] PHYSICIAN: Sebastian Ache, MD  Operative Report   DATE OF PROCEDURE: 10/17/2023  PREOPERATIVE DIAGNOSIS:  Right greater than left renal stones.  PROCEDURE PERFORMED: 1.  Cystoscopy with bilateral retrograde pyelograms interpretation. 2.  Bilateral diagnostic ureteroscopy. 3.  Insertion of bilateral ureteral stents.  ESTIMATED BLOOD LOSS:  Nil.  COMPLICATIONS:  None.  SPECIMENS:  None.  FINDINGS: 1.  Multifocal filling defects in the right lower pole consistent with known stone. 2.  Unremarkable left retrograde pyelogram. 3.  Inability to visualize right ureter proximal to iliac crossing area due to very narrow caliber. 4.  Decision was made to proceed with bilateral stenting and staged approach for ureteroscopy given her very petite anatomy  INDICATIONS:  The patient is a 58 year old lady with a known history of bilateral urolithiasis for quite some time, right greater than left.  She has initially chosen surveillance of this.  She has subsequently developed intermittent right-sided  abdominal and flank pain and there is some concern whether her right-sided stones are intermittently obstructing or perhaps focally obstructing.  Options were discussed including continued surveillance versus shock wave lithotripsy versus ureteroscopy,  unilateral versus bilateral and she wished to proceed with bilateral ureteroscopy with goal of stone free.  She presents for this today and informed consent was obtained and placed in the medical record.   DESCRIPTION OF PROCEDURE:  The patient being verified, procedure being bilateral ureteroscopic stone manipulation was confirmed.  Procedure timeout was performed.  Intravenous antibiotics administered.  General anesthesia was introduced.  The patient  placed into a low lithotomy position.  A sterile field was created,  prepping and draping the patient's vagina, introitus and proximal thigh using iodine.  Cystourethroscopy was performed using a 21-French rigid cystoscope with offset lens.  Inspection of  the urinary bladder was unremarkable.  No papillary lesions or calcifications noted.  The ureteral orifices appeared single bilaterally. The left ureteral orifice was cannulated with a 6-French end-hole catheter and left retrograde pyelogram was  obtained.  Left retrograde pyelogram demonstrated a single left ureter, single system left kidney.  No obvious filling defects or narrowing noted.  No hydronephrosis noted.  A 0.038 ZIPwire was advanced to the level of the upper pole and set aside as a safety wire.   Next, right retrograde pyelogram was obtained.  Right retrograde pyelogram demonstrated single right ureter, single system right kidney.  There were multifocal filling defects in the lower pole and lower infundibulum consistent with known stone.  There was no obvious hydronephrosis noted.  A 0.038  ZIPwire was advanced to the level of the upper pole and set aside as a safety wire.  An 8-French feeding tube placed in the urinary bladder for pressure release.  Next, semirigid ureteroscopy was performed in the distal four-fifths of the left ureter  alongside a separate sensor working wire.  No mucosal abnormalities were found.  The ureter was somewhat narrow in caliber, but did accommodate the two wires and semirigid scope.  Next, semirigid ureteroscopy was performed in the distal right ureter  alongside the separate sensor working wire. The right ureter was much more tight and narrow in caliber and would not safely accommodate the semirigid ureteroscope to a level even to the iliac crossing.  With this petite caliber, there was absolutely no  way that ureteroscopy could safely be performed, especially of her relatively large volume stones. The safest way to proceed  would be to proceed with bilateral stenting, allow  for passive dilation, then reattempt ureteroscopy in several weeks.  As such,  a new 5 x 22 Polaris type stent was carefully placed over the right safety wire using fluoroscopic guidance.  Good proximal and distal plane were noted.  Next, a separate 5 x 22 cm Polaris type stent was placed over the left safety wire using  fluoroscopic guidance.  Good proximal and distal plane were noted.  Procedure was terminated.  The patient tolerated the procedure well.  No immediate periprocedural complications.  The patient was taken to postanesthesia care unit in stable condition  with plan to discharge home.  We will reach out to reschedule her for reattempt bilateral ureteroscopy in several weeks.   SHW D: 10/17/2023 2:43:01 pm T: 10/18/2023 12:12:00 am  JOB: [id number]/ [id number]
